# Patient Record
Sex: Female | Born: 1950 | Race: White | Hispanic: No | Smoking: Former smoker
Health system: Southern US, Community
[De-identification: ages and names within clinical notes are randomized; demographics above are authoritative.]

## PROBLEM LIST (undated history)

## (undated) DIAGNOSIS — I10 Essential (primary) hypertension: Secondary | ICD-10-CM

## (undated) DIAGNOSIS — E782 Mixed hyperlipidemia: Secondary | ICD-10-CM

## (undated) DIAGNOSIS — J449 Chronic obstructive pulmonary disease, unspecified: Secondary | ICD-10-CM

## (undated) DIAGNOSIS — F0390 Unspecified dementia without behavioral disturbance: Secondary | ICD-10-CM

## (undated) DIAGNOSIS — I2510 Atherosclerotic heart disease of native coronary artery without angina pectoris: Secondary | ICD-10-CM

## (undated) DIAGNOSIS — N059 Unspecified nephritic syndrome with unspecified morphologic changes: Secondary | ICD-10-CM

## (undated) DIAGNOSIS — R0602 Shortness of breath: Secondary | ICD-10-CM

## (undated) DIAGNOSIS — E0590 Thyrotoxicosis, unspecified without thyrotoxic crisis or storm: Secondary | ICD-10-CM

## (undated) DIAGNOSIS — G459 Transient cerebral ischemic attack, unspecified: Secondary | ICD-10-CM

## (undated) DIAGNOSIS — K279 Peptic ulcer, site unspecified, unspecified as acute or chronic, without hemorrhage or perforation: Secondary | ICD-10-CM

## (undated) HISTORY — PX: ABDOMINAL HYSTERECTOMY: SHX81

## (undated) HISTORY — DX: Atherosclerotic heart disease of native coronary artery without angina pectoris: I25.10

## (undated) HISTORY — DX: Unspecified nephritic syndrome with unspecified morphologic changes: N05.9

## (undated) HISTORY — DX: Essential (primary) hypertension: I10

## (undated) HISTORY — DX: Unspecified dementia, unspecified severity, without behavioral disturbance, psychotic disturbance, mood disturbance, and anxiety: F03.90

## (undated) HISTORY — DX: Thyrotoxicosis, unspecified without thyrotoxic crisis or storm: E05.90

## (undated) HISTORY — PX: OTHER SURGICAL HISTORY: SHX169

## (undated) HISTORY — DX: Mixed hyperlipidemia: E78.2

## (undated) HISTORY — DX: Peptic ulcer, site unspecified, unspecified as acute or chronic, without hemorrhage or perforation: K27.9

## (undated) HISTORY — PX: LUMBAR SPINE SURGERY: SHX701

## (undated) HISTORY — PX: HEMORRHOID SURGERY: SHX153

## (undated) HISTORY — PX: CARPAL TUNNEL RELEASE: SHX101

---

## 2002-03-27 ENCOUNTER — Encounter: Admission: RE | Admit: 2002-03-27 | Discharge: 2002-06-25 | Payer: Self-pay

## 2003-06-03 ENCOUNTER — Inpatient Hospital Stay (HOSPITAL_COMMUNITY): Admission: EM | Admit: 2003-06-03 | Discharge: 2003-06-04 | Payer: Self-pay | Admitting: Emergency Medicine

## 2006-01-29 ENCOUNTER — Ambulatory Visit: Payer: Self-pay | Admitting: Physical Medicine and Rehabilitation

## 2006-01-29 ENCOUNTER — Encounter
Admission: RE | Admit: 2006-01-29 | Discharge: 2006-04-29 | Payer: Self-pay | Admitting: Physical Medicine and Rehabilitation

## 2006-03-01 ENCOUNTER — Ambulatory Visit (HOSPITAL_COMMUNITY)
Admission: RE | Admit: 2006-03-01 | Discharge: 2006-03-01 | Payer: Self-pay | Admitting: Physical Medicine and Rehabilitation

## 2006-03-26 ENCOUNTER — Ambulatory Visit: Payer: Self-pay | Admitting: Physical Medicine and Rehabilitation

## 2006-03-29 ENCOUNTER — Ambulatory Visit (HOSPITAL_COMMUNITY)
Admission: RE | Admit: 2006-03-29 | Discharge: 2006-03-29 | Payer: Self-pay | Admitting: Physical Medicine and Rehabilitation

## 2006-05-16 ENCOUNTER — Ambulatory Visit: Payer: Self-pay | Admitting: Physical Medicine and Rehabilitation

## 2006-05-16 ENCOUNTER — Encounter
Admission: RE | Admit: 2006-05-16 | Discharge: 2006-08-14 | Payer: Self-pay | Admitting: Physical Medicine and Rehabilitation

## 2006-07-19 ENCOUNTER — Ambulatory Visit: Payer: Self-pay | Admitting: Physical Medicine and Rehabilitation

## 2006-08-16 ENCOUNTER — Encounter
Admission: RE | Admit: 2006-08-16 | Discharge: 2006-11-14 | Payer: Self-pay | Admitting: Physical Medicine and Rehabilitation

## 2006-09-13 ENCOUNTER — Ambulatory Visit: Payer: Self-pay | Admitting: Physical Medicine and Rehabilitation

## 2006-11-14 ENCOUNTER — Encounter
Admission: RE | Admit: 2006-11-14 | Discharge: 2007-02-12 | Payer: Self-pay | Admitting: Physical Medicine and Rehabilitation

## 2006-11-14 ENCOUNTER — Ambulatory Visit: Payer: Self-pay | Admitting: Physical Medicine and Rehabilitation

## 2007-01-08 ENCOUNTER — Ambulatory Visit: Payer: Self-pay | Admitting: Physical Medicine and Rehabilitation

## 2007-03-05 ENCOUNTER — Ambulatory Visit: Payer: Self-pay | Admitting: Physical Medicine and Rehabilitation

## 2007-03-05 ENCOUNTER — Encounter
Admission: RE | Admit: 2007-03-05 | Discharge: 2007-06-03 | Payer: Self-pay | Admitting: Physical Medicine and Rehabilitation

## 2007-04-29 ENCOUNTER — Ambulatory Visit: Payer: Self-pay | Admitting: Physical Medicine and Rehabilitation

## 2007-06-24 ENCOUNTER — Encounter
Admission: RE | Admit: 2007-06-24 | Discharge: 2007-09-22 | Payer: Self-pay | Admitting: Physical Medicine and Rehabilitation

## 2007-06-24 ENCOUNTER — Ambulatory Visit: Payer: Self-pay | Admitting: Physical Medicine and Rehabilitation

## 2007-08-19 ENCOUNTER — Ambulatory Visit: Payer: Self-pay | Admitting: Physical Medicine and Rehabilitation

## 2007-08-20 ENCOUNTER — Ambulatory Visit (HOSPITAL_COMMUNITY)
Admission: RE | Admit: 2007-08-20 | Discharge: 2007-08-20 | Payer: Self-pay | Admitting: Physical Medicine and Rehabilitation

## 2008-04-15 ENCOUNTER — Ambulatory Visit (HOSPITAL_COMMUNITY): Admission: RE | Admit: 2008-04-15 | Discharge: 2008-04-15 | Payer: Self-pay | Admitting: Neurosurgery

## 2008-08-16 ENCOUNTER — Encounter: Admission: RE | Admit: 2008-08-16 | Discharge: 2008-08-16 | Payer: Self-pay | Admitting: Neurosurgery

## 2008-09-15 ENCOUNTER — Inpatient Hospital Stay (HOSPITAL_COMMUNITY): Admission: RE | Admit: 2008-09-15 | Discharge: 2008-09-18 | Payer: Self-pay | Admitting: Neurosurgery

## 2009-03-28 ENCOUNTER — Encounter: Admission: RE | Admit: 2009-03-28 | Discharge: 2009-03-28 | Payer: Self-pay | Admitting: Neurosurgery

## 2009-04-13 ENCOUNTER — Encounter: Admission: RE | Admit: 2009-04-13 | Discharge: 2009-04-13 | Payer: Self-pay | Admitting: Neurosurgery

## 2009-05-17 ENCOUNTER — Encounter: Admission: RE | Admit: 2009-05-17 | Discharge: 2009-05-17 | Payer: Self-pay | Admitting: Neurosurgery

## 2009-06-01 ENCOUNTER — Encounter: Admission: RE | Admit: 2009-06-01 | Discharge: 2009-06-01 | Payer: Self-pay | Admitting: Neurosurgery

## 2011-02-20 NOTE — Assessment & Plan Note (Signed)
[name] is a 60 year old female who is being seen in our pain and  rehab clinic for chronic low back pain and right leg pain.   Pain is worse in the right leg with weightbearing.  She states she does  have some numbness down the back of the leg on occasion, bothers her at  night, some tingling, and dysesthetic sensations.  Pain is worse with  activity, improves with rest and medication.  She is getting good relief  with the current meds.  She is able to walk about 25 minutes at a time.  She is independent with her self-care.  Denies problems controlling  bowel or bladder.  Admits to some depression.  Denies suicidal ideation.   She states in the last month she has had bad bronchitis.  Dr. Liliane Bade  has been taking care of her for this, has had her on some antibiotics.  She had some chest wall pain and he placed her on some Darvocet.  Urine  drug screen done on July 24, 2007, did show propoxyphene in her  urine.  Also, positive for Percocet which is what is prescribed by our  clinic.  She is not on any hydrocodone but states that this was  inconsistent, however, and it was a negative hydrocodone in her urine  drug screen; however, she is currently not on this medicine.   Results of the UDS were reviewed with her today.  She states that Dr.  Liliane Bade just gave her a few Darvocet, less than 10 Darvocet, for her  chest wall pain.  She did not realize it was an opioid narcotic and she  took it for her chest wall pain relief.   She continues to smoke about a pack of cigarettes a day.  She live  alone.   Medications provided by our clinic include only Percocet 7.5/325 two to  4 times a day, #100 per month.   EXAM:  Today, blood pressure is 109/63, pulse 67, respirations 16, and  99% saturated on room air.  She is an obese female who appears her  stated age and does not appear in any distress.  She is oriented x3.  Speech is clear.  Affect is bright, alert, cooperative, and pleasant.  Follows commands without difficulty.  Transitioning from sit to stand  done without problems.  Gait in the room is normal.  Tandem gait.  Romberg test performed adequately.  Limitations are noted in lumbar  motion in all planes.  Seated reflexes are intact at the patellar and  Achilles tendon.  Motor strength is in the 5/5 range without focal  deficit.  Internal and external rotation of her right hip does aggravate  her pain in the posterior buttock region, seems to radiate down the  thigh somewhat as well.  Patient is worse with ambulation in this leg in  this distribution as well.   PLAN:  1. Status post multiple lumbar surgeries, last done 1992, Dr. Claudette Head.  2. Right leg pain, worse with ambulation.  3. Intermittent trochanteric bursitis.  4. Chronic obstructive pulmonary disease.  5. History of Bright disease.  6. History of carpal tunnel syndrome, currently not a problem at this      point.   PLAN:  1. We will refill her Percocet 7.5/325 two to 4 times a day, #100, no      refills.  Nursing staff reviewed her narcotic agreement with her      today.  She needs to have just 1 pharmacy.  She has been using two.      She needs to have 1 prescriber for her narcotics.  She states she      understands this and will comply.  2. UDS in the next couple of months to half a year where we will also      do a pharmacy check and we will obtain some radiographs of her      right hip to rule out hip osteoarthritis.  We will see her back in      a month.           ______________________________  Brantley Stage, M.D.     DMK/MedQ  D:  08/20/2007 13:45:18  T:  08/21/2007 10:41:18  Job #:  [id number]   cc:   Dr. Gae Gallop

## 2011-02-20 NOTE — H&P (Signed)
Fowler, Susan             ACCOUNT NO.:  [id number]   MEDICAL RECORD NO.:  [id number]          PATIENT TYPE:  INP   LOCATION:  3010                         FACILITY:  [facility]   PHYSICIAN:  Hilda Lias, M.D.   DATE OF BIRTH:  05/16/1951   DATE OF ADMISSION:  09/15/2008  DATE OF DISCHARGE:                              HISTORY & PHYSICAL   Susan Fowler is a lady who was seen initially in July complaining of back  pain with radiation to the right leg.  This lady in the past has had  three lumbar fusions at the level of 4-5, 5-1.  Off and on she is having  back pain for almost 17 years, but now it is getting worse down to the  right leg associated with weakness and numbness sensation.  We did some  x-rays including diskogram, which was positive at the level of L2-L3 and  L3-L4 with a herniated disk and degenerative disk disease and facet  arthropathy at those two levels.  The area where she was fused before 4-  5, 5-1 is solid.  She want to proceed with surgery.   PAST MEDICAL HISTORY:  She has a history of hypertension, increased  cholesterol.  She has a hysterectomy, carpal tunnel surgery, and lumbar  fusion by somebody else at the level of 4-5, 5-1.   FAMILY HISTORY:  Positive for diabetes and hypertension.   SOCIAL HISTORY:  Negative.   PHYSICAL EXAMINATION:  GENERAL:  The patient came to my office limping  from the right leg.  HEAD, EARS, NOSE AND THROAT:  Normal.  NECK:  Normal.  LUNGS:  Clear.  ABDOMEN:  Normal.  EXTREMITIES:  Normal.  NEUROLOGIC:  She has decreased flexion of the lumbar spine.  She has  lumbar spine scar from previous surgery.  She has normal strength in the  upper and lower extremities with mild weakness with dorsiflexion of the  right foot.   X-rays including the diskogram showed that she has a positive diskogram  at the level of 2-3 and 3-4 with degenerative disk disease and facet  arthropathy.   CLINICAL IMPRESSION:  Degenerative disk disease  2-3, 3-4.  Status post  lumbar fusion 4-5, 5-1.   RECOMMENDATIONS:  The patient is being admitted for surgery.  The  procedure will be at L2-3, 3-4 diskectomy, interbody fusion with pedicle  screws.  She knows about the risks with surgery such as infection, CSF  leak, worsening pain, no improvement whatsoever, need for further  surgery, paralysis, damage to the conus, which might affect the bladder  and bowel.           ______________________________  Hilda Lias, M.D.    EB/MEDQ  D:  09/15/2008  T:  09/16/2008  Job:  [id number]

## 2011-02-20 NOTE — Assessment & Plan Note (Signed)
Susan Fowler is a 60 year old white female who is being seen in our pain  and rehab clinic for chronic low back pain, radiating right lower  extremity pain.  She has a diagnosis of spinal stenosis and occasional  trochanteric bursitis.   She is back in today.  Average pain is about a 6 on a scale of 10.  Again, pain localized to the right low back to the left and right  buttocks, posteriorly down the leg to the foot, worse with walking and  sitting, improved with rest, medication.  Also seems to improve with  pool activities.  She has had access to a pool for the last couple of  months and has done quite well with it.  She reports good relief with  the current medications that she is prescribed.   Medications from this clinic include:  1. Percocet 7.5/325 mg one p.o. b.i.d. to q.i.d., #100 per month.  2. She is currently no longer on Neurontin.   She is able to walk about 15 minutes at a time.  She is able to climb  stairs and drive.  She is independent with her self-care, needs some  assistance with higher-level household duties.  She admits to some  anxiety.  She denies depression and suicidal ideation.  She denies  problems controlling bowel or bladder.  She is a smoker, a pack a day,  recently was treated with bronchitis with shots and antibiotics.  She  follows up with Dr. Virgina Organ for her primary care problems.   On exam today, her blood pressure is 82/53, pulse 68, respirations 18,  97% saturated on room air.  She a well-developed, well-nourished, mildly obese female who appears  her stated age.  She is oriented x3.  Affect is bright, alert,  cooperative and present.  Speech is clear.  Follows commands without  difficulty.  Transitions from sitting to standing easily.  Gait in the room is not  antalgic.  Tandem gait, Romberg test are performed adequately.  Limitations noted in lumbar motion, especially with lumbar extension,  very little extension noted.  She has a minimal  amount of lumbar  flexion.  Reflexes are 1+ at the patellar and Achilles tendons.  No abnormal tone  is noted.  Motor strength is in the 5/5 range.  Straight leg raise  negative.  No clonus is noted.   IMPRESSION:  1. Status post multiple lumbar surgeries.  Last surgery was 1992 with      Dr. Jeral Fruit.  2. Spinal stenosis-like symptoms with diminished capacity to ambulate,      intermittent chronic right lower extremity neuropathic-type pain.  3. Intermittent right trochanteric bursitis.  4. Chronic obstructive pulmonary disease.  5. History of Bright disease.  6. History of carpal tunnel, currently not a problem at this time.   PLAN:  Refilled Percocet 7.5/325 mg one p.o. b.i.d. to q.i.d. p.r.n.  back pain, #100, no refills.  Follow up with nursing staff next month  for refill of her medications.  I will see her back in 2 months.  She  has not exhibited any aberrant behavior, her pill counts are  appropriate, and she is able to maintain a relatively functional  lifestyle, including engaging in some physical activity including a pool  program at this time.          ______________________________  Brantley Stage, M.D.    DMK/MedQ  D:  04/30/2007 11:18:41  T:  04/30/2007 15:21:08  Job #:  [id number]  cc:   Dr. Virgina Organ

## 2011-02-20 NOTE — Assessment & Plan Note (Signed)
[name] is a 60 year old white female who is being seen in our Pain  and Rehab Clinic for complaints of chronic low back pain and radiating  right lower extremity pain.  She has the diagnosis of spinal stenosis  and occasional trochanteric bursitis.  She is back in today and states  her average pain is about 6 on a scale of 10.  The pain is described as  constant, sharp and burning in nature, localized to the right lumbar  region, through the buttock and down the right posterior leg to the  lateral foot.  The pain waxes and wanes in the course of standing and in  prolonged position such as sitting, repetitive bending or walking and  improved with rest, heat or medications.  She gets fair relief with the  current medications prescribed from this clinic which include Percocet  7.5/325 one p.o. twice daily to four times daily.   She is able to walk about 20 minutes at a time.  She is looking forward  to the summer so she can start doing some swimming.  She is able to  climb stairs, able to drive, independent with self-care and is able to  engage in higher level household activities.  She does admit to  depression but denied suicidal ideation.  In the last month since she  was seen, she has had treatment for bronchitis and for a vaginal yeast  infection.  Her PCP is Dr. Dyann Ruddle.  Otherwise, no changes in her social  or family history.  She continues to smoke one pack of cigarettes per  month.  I cautioned against this again.   PHYSICAL EXAMINATION:  VITAL SIGNS:  Blood pressure this morning was  85/50, pulse 61, respirations 16, 97% saturations on room air.  GENERAL:  She is well-developed, mildly obese white female who does not  appear to be in any distress, oriented x3, speech clear, affect  appropriate, alert, cooperative and pleasant.  She follows commands  without any difficulty.  MUSCULOSKELETAL:  She transitions from sitting to standing easily.  Gait  is symmetric and nonantalgic.   She has limitations in lumbar extension.  She has just mild limitations with lumbar flexion.  Reflexes are  symmetric at the patella and Achilles tendon bilaterally.  No abnormal  tone is noted, no clonus is noted.  Pinprick is intact in the lower  extremities.  Her motor strength is 5/5 in knee flexors, knee extensors,  dorsiflexors and plantar flexors.  Straight leg raise is negative.   IMPRESSION:  1. Status post multiple lumbar surgeries; last surgery was in 1992      with Dr. Jeral Fruit.  2. Spinal stenosis-like symptoms with diminished capacity to ambulate      and intermittent chronic right lower extremity neuropathic type      pain.  3. Intermittent right trochanteric bursitis.  4. Chronic obstructive pulmonary disease.  5. History of Bright's disease.  The patient did bring in her blood      work which was done on 10/30/2006.  Her BUN was 20, creatinine      0.98.  The rest of her blood work is attached to the chart today.  6. She does have a history of carpal tunnel which is currently not a      problem at this time.   PLAN:  1. We will refill her Percocet 7.5/325 one p.o. twice daily to four      times daily (100, 0 refills).  2. She continues  to be a high-functioning individual.  She continues      to walk several times per day up to 20 minutes at a time.  She is      able to help take care of her family.  She is independent with all      of her self-care.  We      will see her back in 2 months with a nursing visit next month for      refill of her medications.  She has been stable on Percocet.  No      aberrant behavior has been displayed and she has appropriate pill      counts.           ______________________________  Brantley Stage, M.D.     DMK/MedQ  D:  03/06/2007 09:48:55  T:  03/06/2007 10:25:53  Job #:  [id number]   cc:   Prescott Parma  Fax: [phone]

## 2011-02-20 NOTE — Op Note (Signed)
NAMECHRISTABELLA, ALVIRA             ACCOUNT NO.:  [id number]   MEDICAL RECORD NO.:  [id number]          PATIENT TYPE:  INP   LOCATION:  3010                         FACILITY:  [facility]   PHYSICIAN:  Hilda Lias, M.D.   DATE OF BIRTH:  [birth year]   DATE OF PROCEDURE:  09/15/2008  DATE OF DISCHARGE:                               OPERATIVE REPORT   PREOPERATIVE DIAGNOSES:  Chronic back pain with degenerative disk  disease, L2-L3, L3-L4.  Status post fusion, L4-L5, L5-S1.   POSTOPERATIVE DIAGNOSES:  Chronic back pain with degenerative disk  disease, L2-L3, L3-L4.  Status post fusion, L4-L5, L5-S1.   PROCEDURES:  Bilateral L3-L4 laminectomy and facetectomy, L2-L3 total  diskectomy, foraminotomy, interbody fusion with cage at the level of L2-  L3, pedicle screws L2, L3, L4 bilaterally, posterolateral arthrodesis  with autograft and BMP, Cell Saver, and C-arm.   SURGEON:  Hilda Lias, MD   CLINICAL HISTORY:  Mrs. Coatney is a lady who in the past underwent  several surgeries ending with a fusion at the level of L4-L5 and L5-S1  in an another hospital.  Lately, she had been complaining of back pain  with radiation to both legs.  Right is worse than the left one.  X-ray  and diskogram showed that she had severe degenerative disk disease and  facet arthropathy at the level of L2-L3 and L3-L4.  The areas between L4-  L5 and L5-S1 are grossly normal.  The patient has failed with  conservative treatment.  She wanted to proceed with surgery.   PROCEDURE IN DETAIL:  The patient was taken to the OR and she was  positioned in a prone manner.  Because of her obesity, it was difficult  to feel any spinous process.  X-ray showed that we were at the level of  L2.  A midline incision from L2 down to the upper part of the previous  incision was made with muscle and fascia retracted all the way  laterally.  We repeated another x-ray which showed that indeed the clips  were at the level of the spinous  process of L2 and L3.  From then on,  after we had good retraction, we removed the spinous process of L2 and  L3 and we did a bilateral laminectomy.  Still it was difficult to get  into the disk space and to be able to get into the disk, we proceeded  with bilateral facetectomy of L3 and L2.  From then on, it was easy to  retract the thecal sac.  We entered the disk.  Another x-ray was  obtained, which showed that indeed we were at the level of the disk of  L2-L3.  Retraction of the thecal sac was made.  We entered the disk  space and total gross diskectomy was achieved.  The endplate was  removed.  Then 2 cages of 12 x 22 with autograft and BMP inside were  inserted.  The rest of the disk was filled up with autograft.  We tried  to enter at the level of L3-L4 disk, but this area was completely fused.  Because  of that, we decided not to proceed with any interbody fusion at  this level.  We decompressed the L2, L3, and L4 nerve roots.  Then with  the C-arm, first in AP view and later on the lateral view, we probed the  pedicle of L2, L3, and L4.  At the end, we were able to insert 6 pedicle  screws of 5.5 x 40.  Prior to insertion, we were sure that the probe and  the hole in the pedicle was surrounded by bones.  After that, the  pedicle screws were connected with rods and secured in place with caps.  Then a cross-link from right to left was used.  We went laterally and we  removed the periosteum of L2, L3, and L4 as well as the transverse  process.  Then a mix of BMP and autograft was used for arthrodesis.  Valsalva maneuver was negative.  Fentanyl was left in the epidural space  and the wound was closed with Vicryl and staples.           ______________________________  Hilda Lias, M.D.     EB/MEDQ  D:  09/15/2008  T:  09/16/2008  Job:  [id number]

## 2011-02-20 NOTE — Assessment & Plan Note (Signed)
Susan Fowler is a 60 year old woman who is being followed in our pain and  rehabilitative clinic for chronic low back pain and right leg pain.   She states her average pain is about a 7/10 in the low back, about a  5/10 in the leg.  The pain is described as sharp, constant, aching in  nature.  Localized to the right low back and radiates posteriorly down  the right leg.   Pain is worse with walking, bending, sitting, prolonged standing.  Improves with rest, medications.  The pain is worse toward the end of  the day.   She is able to walk at least 30 minutes at a time.  She is able to climb  stairs and drive.  She is independent with her self care.   REVIEW OF SYSTEMS:  Otherwise, noncontributory.   Past social, medical, and family history otherwise unchanged.  Continues  to smoke a pack of cigarettes a day.  Cautioned against this.   SULFA allergy and TAPE allergy.   MEDICATIONS PRESCRIBED BY THIS CLINIC:  Percocet 7.5/325 one p.o. b.i.d.  to q.i.d. #100 p.r.n. back or leg pain.   EXAM:  Blood pressure 108/73, pulse 73, respirations 18, 95% saturation  on room air.  She is well-developed, well-nourished female who does not appear in any  distress.  She is oriented x3.  Her speech is clear.  Her affect is  bright.  She is alert, cooperative, and pleasant, and she follows  commands without any difficulty.   She transitions from sitting to standing easily.  Gait in the room is  normal.  Tandem gait, Romberg test are performed adequately.  Limitations are noted in lumbar motion in both flexion as well as  extension.  Pain is worse with extension.   Reflexes are 1+ at the patellar and Achilles tendons.  Symmetric.  No  abnormal tone is noted.  No clonus is noted.  Motor strength is 5/5 at  the hip flexors, knee extensors, dorsiflexors, plantar flexors without  any weakness noted.   IMPRESSION:  Lumbago status post lumbar surgery.  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  Also noted was some left foraminal encroachment  with possible impingement of left L3 nerve root, although she has no  left leg pain.   Flexion and extension films done the month prior to that showed no  instability.   She does have intermittent trochanteric bursitis.  Her medical problems  include:  1. Chronic obstructive pulmonary disease.  2. History of Bright's disease.  3. History of carpal tunnel syndrome.   PLAN:  Will refill her Percocet 7.5/325 two to four times a day 100 per  month, no refills.  She states she is complying and using only 1  pharmacy, and is taking her medications as appropriate.  Anticipate UDS  in the next  few months.  She would like to hold off on any kind of diagnostic study  until after Christmas, in January.  Anticipate we will order some hip  radiographs.           ______________________________  Brantley Stage, M.D.     DMK/MedQ  D:  09/17/2007 12:35:08  T:  09/17/2007 17:16:47  Job #:  [id number]

## 2011-02-20 NOTE — Assessment & Plan Note (Signed)
Susan Fowler is a 60 year old white female who is being seen in our Pain  and Rehabilitative Clinic for chronic low back pain, radiating right  lower extremity pain.  She is diagnosed with spinal stenosis and  occasional trochanteric bursitis.   She is back in today requesting refill of her medications.  Her average  pain is between a 6 and a 7 on a scale of 10, moderately interfering  with her activity levels and enjoyment of life.  Pain is described as  achy, dull, and radiating at times to the buttock and down the right  lower extremity posteriorly to the ankle.   Pain is typically worse toward the end of the day.  Medications provided  by this clinic give her between fair and good relief.   Functional status is quite good.  She is independent with her self-care  and higher-level activities.  Able to walk 25 minutes at a time.  She is  able to drive and climb stairs.   No other changes noted in the past medical, social, or family history.   REVIEW OF SYSTEMS:  Noncontributory today.   Continues to smoke a pack of cigarettes a day.   Blood pressure today is 100/62, pulse 75, respirations 18; 96% saturated  on room air.  She is a well-developed, mildly obese female who appears  her stated age.  She does not appear in any distress.  She is oriented  x3.  Speech is clear.  Affect is bright, alert.  She is cooperative and  pleasant.  She follows commands without difficulty.   Transitioning from sit to stand is done somewhat slowly.  She has a  normal gait, however, in the room; tandem gait.  Romberg's test is  performed adequately seated.  Reflexes are symmetric and intact in the  lower extremities without focal deficit.   Motor strength is in the 5/5 range at hip flexors, knee extensors,  dorsiflexors, plantar flexors, EHL.  Straight leg raise negative.   Sensory exam not performed today.   Medications provided by this clinic include Percocet 7.5/325 one p.o.  b.i.d. to q.i.d.,  #100 per month.   IMPRESSION:  1. Status post multiple lumbar surgeries; last surgery done in 1992      with Dr. Jeral Fruit.  2. Spinal stenosis-like symptoms with diminished capacity to ambulate.      Intermittent chronic right lower extremity neuropathic-type pain.  3. Intermittent trochanteric bursitis.  4. Chronic obstructive pulmonary disease.  5. History of Bright's disease.  6. History of carpal tunnel syndrome, currently not a problem at this      time.   PLAN:  We will plan to refill her Percocet for her, 7.5/325 one p.o.  b.i.d. to q.i.d. p.r.n. back pain, right leg pain, No. 100, no refills.   She has not exhibited any aberrant behavior with use of this medication.  Her pill counts have been appropriate, and she is able to maintain a  relatively functional lifestyle at this time.  We will have a nursing  visit for her next month for refill of medications.  I will see her back  in 2 months.           ______________________________  Brantley Stage, M.D.     DMK/MedQ  D:  06/25/2007 09:07:38  T:  06/25/2007 10:10:00  Job #:  [id number]

## 2011-02-23 NOTE — Assessment & Plan Note (Signed)
The patient is a 60 year old white female who is being seen in our [facility] for complaints of low back pain and radiating  right lower extremity pain, which is consistent with spinal stenosis and  an occasional right trochanteric bursitis.   She is back in today, states her average pain is about 6 on a scale on  10.   She states she has lost about 12 pounds, since I had last saw her in  February 2008.  She has been dieting and walking regularly.   Her pain is controlled with two to four 7.5/325 Percocet each day.   She reports no problems with constipation or oversedation from the  medication.  She is functional, independent with self care and higher  level household activities.   No trouble controlling bowel or bladder.  No problems with depression,  anxiety, or suicidal ideation.   She continues to maintain contact with Dr. Marilu Favre, who manages her  primary care needs.  She states she has had some bronchitis several  times since I saw and she had some recent mammograms, which were  apparently read as okay.   She continues to smoke a pack of cigarettes a day.  Cautioned against  this.   EXAMINATION:  Her blood pressure is 107/70.  Pulse 96.  Respirations 18.  One hundred percent saturated on room air.   She is a well-developed, mildly obese female who appears her stated age.  She does not appear in any distress.  She is oriented x3.  Her affect is  bright, alert.  She is cooperative, pleasant, and she follows commands  without any difficulty.  She transitions from sit to stand easily.  Her  gait in the room is stable.  She is able to perform tandem gait and  Romberg's test without difficulty.   Limitations are noted in her lumbar range of motion in all planes.  She  has essentially no extension and minimal forward flexion.   Reflexes are 2 at the patellar tendons bilaterally, 2 at the Achilles  tendons bilaterally.  No abnormal tone is noted.  No  clonus is noted.  Pinprick does not reveal any sensory deficit in the lower extremities  below the knees.   Her motor strength is 5/5 with hip flexors, knee extensors,  dorsiflexors, plantar flexors.  Slight difference in EHL, right versus  left, just a little weaker on the right compared to left foot in the 4+  range.   Straight leg raise negative.   IMPRESSION:  1. Status post multiple lumbar surgeries.  Two surgeries in 1991, last      one in 1992, Dr. __________.  2. Spinal stenosis-like symptoms with diminished capacity to ambulate      and fairly chronic right lower extremity neuropathic type pain.  3. Intermittent right trochanteric bursitis.  4. Chronic obstructive pulmonary disease.  5. 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  6. History of anxiety/depression, currently not a problem at this      time.  7. History of carpal tunnel syndrome, also not a problem at this time.   PLAN:  She continues to be a fairly high functioning individual.  She is  walking several times a day and dieting and losing weight.   She is doing well on Percocet, not more than 100 pills per month.  Taking them between 2 and 4 times a day.  Her pill counts  have been  appropriate.  She does not call in for early request.  She displays no  aberrant behavior in this clinic.   Would like her to bring in copy of blood work with BUN and creatinine on  it for our chart in light of her history of kidney problems in the past.  She will try to get a copy from Dr. Marilu Favre prior to our next visit,  which will be in 1 month.   We will cc a copy of my note over to Dr. Marilu Favre as well.           ______________________________  Brantley Stage, M.D.     DMK/MedQ  D:  02/06/2007 10:19:06  T:  02/06/2007 11:17:01  Job #:  [id number]   cc:   Marilu Favre, M.D.

## 2011-02-23 NOTE — Assessment & Plan Note (Signed)
Susan Fowler is a 60 year old white female who is a patient of Dr. Rollen Sox.   She is being seen for chronic right low back pain, lateral leg pain and  posterior thigh pain.   She states her pain in the clinic today is about 3 on a scale of 10,  averages typically around an 8 on a scale of 10.  She improves her pain  scores after she takes her medications.  She reports good relief with the  current medications that she is on.  She typically takes two Percocet 4  times a day.   She did not bring in her medications for pill count today.  She is currently  still getting her medications filled by Dr. Dyann Ruddle.   Pain is typically located in the low back, radiates down the lateral right  leg as well as the posterior right leg, appears to be two different types of  pain here.  She can walk about 20 minutes at a time.  She is able to climb  stairs, she drives.  She stays quite active.  Typically is flared up doing  activities such as changing sheets, which might require her to bend forward  and twist in her lumbar area.   No new changes in past medical social or family history since our last  visit.  The patient was asked to obtain previous old MRI films for this  visit; however, she was unable to obtain them.  Apparently they are from the  1990's and have been archived.  She is having a difficult time accessing  them.   EXAM TODAY:  Blood pressure 95/46, pulse 67, respirations 16, 98% saturated  on room air.  She is an obese white female who appears her stated age.  She  is oriented x3.  Affect is bright, alert, cooperative and pleasant.   She is able to stand.  She transitions from sit-to-stand without any  difficulties.  Her gait in the room is nonantalgic.  She has a fairly  symmetric gait pattern.  Balance is good.  Romberg's test and tandem gait is  within normal limits.  Seated reflexes are 2+ patella tendons and Achilles  tendons.  Motor strength is good in the lower extremities.   Lumbar area  reveals old scars over the lumbar midline as well as over bilateral iliac  crest.   IMPRESSION:  1.  Status post lumbar fusion 1992, Dr. Jeral Fruit.  It is not clear which      levels were done.  Need to obtain some operative notes.  At this point,      would like to obtain flexion extension films of her lumbar spine to rule      out any new instability.  2.  Intermittent neuropathic pain right lower extremity.  3.  Trochanteric bursitis/iliotibial band syndrome.  Patient continues to be      tender on exam today when trochanter area, both right and left, are      palpated.  She is more tender on the right than on the left.  4.  Consider sacroiliac joint as possible pain generator in this case as      well, with history of lumbar fusion.  5.  History of chronic obstructive pulmonary disease.  6.  Questionable right kidney function.  7.  History of carpal tunnel surgery.  8.  History of anxiety/depression.   PLAN:  Will set patient up to obtain flexion extension lumbar films.  Discussed further interventions such as  a trochanter injection on the right,  possibly considering SI injection vs. facet injection.  Would like to obtain  x-rays prior to this.  She currently has medications provided by Dr. Dyann Ruddle  at this point.  She did not bring in her meds, is  not clear on what her pill counts are at this point.  She will bring them in  at the next visit.  Anticipate we will be taking over narcotic management  for her.  Will see her back in 1 month.           ______________________________  Brantley Stage, M.D.     DMK/MedQ  D:  03/01/2006 10:16:14  T:  03/01/2006 18:03:04  Job #:  [id number]   cc:   Prescott Parma  Fax: [phone]

## 2011-02-23 NOTE — Assessment & Plan Note (Signed)
HISTORY:  Susan Fowler is a 60 year old white female who is a patient of Dr.  Dyann Ruddle.  She is being seen in our pain and rehabilitation clinic for  management of chronic low back pain, radiating right lower extremity pain  consistent with spinal stenosis as well as right trochanteric bursitis.   She is back in for a recheck and refill of her medications.   On review of the medications she is taking she states that she is taking up  to 6 Excedrin a day and 8 Tylenol per day in addition to four Percocet per  day.  I calculated her acetaminophen intake to be about 5400 mg.   Back in June I had discussed with her how much acetaminophen I would like  her to be taking and that would be less than 2000 mg a day.  She states that  she needs to take something for her pain and to help her sleep at night.   She states her average pain is about a 7 on a scale of 10.  The pain is  described as sharp and burning, localized to the right low back through the  posterior thigh into the leg, especially when she is up walking.  The pain  is typically worse with walking and standing type activities.  She gets good  relief with the current medications she states.  She can walk about 10  minutes at a time.  She is independent with her self care.   PAST MEDICAL/SOCIAL/FAMILY HISTORY:  No changes in past medical, social or  family history.  She continues to smoke two to three cigarettes per day.   PHYSICAL EXAMINATION:  VITAL SIGNS:  Blood pressure 93/56, pulse 83,  respirations 16, 96% saturation on room air.  GENERAL APPEARANCE:  She is an obese female who appears her stated age.  She  is oriented x3.  Her affect is bright and alert.  She is cooperative  pleasant.  Her speech is clear.  She follows commands without any  difficulty.  MUSCULOSKELETAL:  She transitions from sit to stand without any difficulty.  Her balance is good.  Tandem gait and Romberg's test are within normal  limits.   She has  limitations in lumbar range of motion especially with forward  flexion and extension.  Seated her reflexes are symmetric at patellar  tendons and Achilles tendons and present.  Motor strength is good throughout  both lower extremities.  No focal weakness is noted.  She denies any sensory  deficits to light touch.  Straight leg raise is negative.   IMPRESSION:  1. Status post lumbar surgical procedure by Dr. Jeral Fruit in 1992.  2. Spinal stenosis-like symptoms with diminished capacity to ambulate and      right lower extremity pain.  3. Right trochanteric bursitis, which is overall improved.   PROBLEM LIST:  Medical problems include:  1. Chronic obstructive pulmonary disease.  2. Questionable right kidney function.  3. History of anxiety and depression.  4. History of carpal tunnel syndrome.   PLAN:  Thirty minutes were spent discussing use of medications with Ms.  Selvage again today.  I reiterate the importance of not taking more than 2000  mg of acetaminophen on a daily basis and she is doing this essentially on a  long term basis.  We discussed possibly following back up with Dr. Jeral Fruit.  She does have an MRI dated March 29, 2006 which shows mild spinal stenosis at  L2-L3 and moderate spinal stenosis  at L3-L4 with some foraminal encroachment  of the left L3 nerve root, however, her pain is in the right lower  extremity.   Other options we discussed are adding Neurontin.  She has been reluctant to  trial Neurontin in the past, however, she would like to consider it this  month.  We will start her on 300 mg at night and titrate this up to 3x a  day.  We also discussed the possibility of trialing an epidural.  She has  been quite adamant in the past about not undergoing any lumbar epidural  injections.  Apparently she had a bad experience over a decade ago but she  is now considering a trial, possibly in the upcoming months.   She has been taking her Percocet as directed.  She has not  displayed any  aberrant behavior with narcotic use in this clinic.  We will see her back in  a month.  We will send a copy of my note to Dr. Dyann Ruddle.  Will send a copy  of the lumbar MRI from June of 2007 in addition to the note today.           ______________________________  Brantley Stage, M.D.     DMK/MedQ  D:  07/22/2006 12:42:42  T:  07/23/2006 10:11:41  Job #:  [id number]   cc:   Prescott Parma  Fax: [phone]   CC of MRI June 07 to Dr Larena Glassman also.

## 2011-02-23 NOTE — Discharge Summary (Signed)
Susan Fowler, Susan Fowler             ACCOUNT NO.:  [id number]   MEDICAL RECORD NO.:  [id number]          PATIENT TYPE:  INP   LOCATION:  3010                         FACILITY:  [facility]   PHYSICIAN:  Clydene Fake, M.D.  DATE OF BIRTH:  02-Nov-1950   DATE OF ADMISSION:  09/15/2008  DATE OF DISCHARGE:  09/18/2008                               DISCHARGE SUMMARY   PREOPERATIVE DIAGNOSIS:  Degenerative disk disease at L2-3, L3-4 with  prior fusion below that with radiculopathy.   POSTOPERATIVE DIAGNOSIS:  Degenerative disk disease at L2-3, L3-4 with  prior fusion below that with radiculopathy.   PROCEDURE:  Bilateral laminectomy, facetectomy 2-3 and 3-4 with  interbody cages at 2-3 and pedicle screw fixation with allograft and  Infuse.   REASON FOR ADMISSION:  The patient is a 60 year old lady who has had  back and right leg pain with prior fusion at 4-5 and 5-1.  A workup was  done including diskogram, showing herniated disk, degenerative disk  disease, and positive diskogram at L2-3 and L3-4.  The patient was  brought in for decompression and fusion at these levels.   HOSPITAL COURSE:  The patient underwent surgery with Dr. Jeral Fruit on  September 15, 2008, with no complications.  The patient then transferred  from recovery room to the floor.  Postoperatively, she started making  progress, started working with PT to increase activity, working, getting  brace on.  She has continued making progress.  PCA was discontinued on  September 17, 2008, and started taking p.o. pills.  She is eating well  and ambulating well.  Incision was clean, dry, and intact on September 18, 2008, and was discharged home in stable condition with a rolling  walker to use as assistance to ambulate.  Increase activity, brace when  up.   MEDICINES:  Percocet and Flexeril p.r.n. and same, continue all her  preop medications.   FOLLOWUP:  Follow up in 10-12 days with Dr. Jeral Fruit.     ______________________________  Clydene Fake, M.D.     JRH/MEDQ  D:  11/04/2008  T:  11/05/2008  Job:  [id number]

## 2011-02-23 NOTE — Assessment & Plan Note (Signed)
HISTORY OF PRESENT ILLNESS:  Susan Fowler is a 60 year old white female who is  a patient of Dr. Rollen Sox.   She has been seen in our pain and rehabilitative clinic for management of  chronic low back pain, radiating right lower extremity pain, with diminished  capacity to ambulate, and right trochanteric bursitis.   She states that the right hip is still somewhat improved since the  injection.  She states she can sleep better, especially when she rolls over  onto that side it does not wake her up anymore.   She reports good relief with the current medications that she is on.  Last  month, she was switched to Percocet 7.5/325 up to 4 a day.  She states she  gets fairly good relief with the current medications that she is on.  The  pain is typically aggravated by prolonged walking, sitting, bending,  standing.  Improved with medications and rest.   She can walk about 10 minutes at a time.  She is independent with her self  care.  Denies bowel or bladder problems.  Denies suicidal ideation, anxiety  or depression.   MEDICATION:  Per Dr. Dyann Ruddle include:  1. Flexeril.  2. Albuterol.  3. Excedrin for migraines.  4. Tylenol PM.  5. Aspirin.  6. Paxil.  7. Mevacor.  8. Cardizem.  9. Hydrochlorothiazide.  10.Vasotec.  11.Lopressor.  12.Xanax.  She just recently started Xanax 0.5 three times a day to help      her with smoking cessation.  She had been smoking approximately 3 packs      a day at our last visit; she is down to a pack and a half now.   PHYSICAL EXAMINATION:  VITAL SIGNS:  Blood pressure is 84/46, pulse 74,  respirations 18, 96% saturation on room air.  GENERAL:  She is a well-developed, well-nourished female who appears her  stated age.  She is oriented x3.  Her affect is bright and alert.  She is  cooperative and pleasant.  She does not appear in any distress.   She transitions from sit to stand without much difficulty.  She does appear  a little bit stiff, however.   Her gait in the room is symmetric.  Normal  base of support is noted.  Her balance is good.  Romberg test is normal, and  tandem gait is also normal.  She has limitations in lumbar range.  Her  reflexes are 2+ at the patellar and Achilles tendons, symmetric.  No  abnormal tone is noted.  Motor strength is 5/5.  No focal weakness is  appreciated.  No new numbness is appreciated with light touch.   IMPRESSION:  1. Status post lumbar surgical procedure in 1992, Dr. Jeral Fruit.  2. Intermittent neuropathic pain in the right lower extremity with      stenosis-like symptoms.  3. Right trochanteric bursitis, overall improved.  4. Chronic obstructive pulmonary disease.  Reduced smoking from 3 packs a      day to 1-1/2 packs per day.  5. Questionable right kidney function.  6. History of anxiety depression.  7. History of carpal tunnel surgery.   PLAN:  Will refill her Percocet today, 7.5/325 one p.o. b.i.d. to q.i.d.,  #120.  She continues to take her medications appropriately.  No aberrant  behavior has been appreciated.  She is able to maintain function.  She is  attempting to lose weight, and she is able to get out into the community  somewhat with the  use of the medications.  I will see her back in 1 month.  She is stable on this medicine.           ______________________________  Brantley Stage, M.D.     DMK/MedQ  D:  06/17/2006 13:33:33  T:  06/18/2006 02:35:09  Job #:  [id number]

## 2011-02-23 NOTE — Group Therapy Note (Signed)
Ms. Grandmaison is a 60 year old white female referred by Dr. Virgina Organ.   She is referred for chronic low back and right leg pain.   She states she has a history of back pain dating back to the early 1990s.  She has had multiple surgeries per Dr. Jeral Fruit, two in 1990 and one in 1992,  but underwent a fusion at that time by Dr. Jeral Fruit.   Since that time she has had chronic low back pain and right leg pain.  Her  average pain is about a 6 on a scale of 1-10, typically worse in the morning  and at night.  Pain is worse with standing, improves with some rest and  medication.  Gets good relief with the current medications that she is on.   She states she is referred over for pain management of her low back and  right leg pain.   She is able to be up walking at least an hour at a time.  States she went  recently to a farmer's market.  She is able to climb stairs.  She is  currently driving.   She is independent with most of her self care and some higher level  activities including meal prep, household duties, and shopping.   She has been disabled since 1993.  She states she has had some problems with  her bladder.  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  I do not have any surgical  notes regarding this particular operation, however, in this chart.  She does  admit to some depression and anxiety.   PAST MEDICAL HISTORY:  1.  Positive for some lung problems.  2.  High blood pressure.  3.  Kidney disease.  4.  Arthritis.   PAST SURGICAL HISTORY:  1.  Three lumbar spine surgeries, Dr. Jeral Fruit 1990 x2, 1991 x1.  2.  History of carpal tunnel surgery x2.  3.  Urethral surgery.  4.  Hysterectomy.  5.  Hemorrhoid surgery.  6.  Tonsillectomy.   SOCIAL HISTORY:  Patient is divorced.  She lives alone.  Denies illegal drug  use.  Denies driving under the influence.  Denies alcohol use.  Denies  smoking.   FAMILY HISTORY:  Positive for hypertension, alcohol abuse.   Also of significance her son who was a  drug abuser killed himself at age 68.  Patient states mother is an alcoholic as well.   PHYSICAL EXAMINATION:  VITAL SIGNS:  Blood pressure 96/59, pulse 72,  respirations 16, 95% saturated on room air.  GENERAL:  She is an obese white female who appears her stated age.  She is  oriented x3.  Affect is bright, alert, cooperative, and pleasant.  NEUROLOGIC:  Transitions from sit to stand without much difficulty.  Gait is  normal.  Some limitations in lumbar range especially with forward flexion.  Extension she is also quite limited.  Sitted reflexes are symmetric and  intact in lower extremities.  Motor strength is 5/5 in the lower  extremities.  No focal weakness noted.  Straight leg raise is negative.  No  sensory deficits to light touch, pin prick, proprioception intact.   Patient is quite tender along the iliotibial band bilaterally, worse on the  right than on the left.   IMPRESSION:  1.  Lumbago status post lumbar fusion 1992 Dr. Jeral Fruit.  2.  Intermittent neuropathic right leg pain.  3.  Trochanteric bursitis/iliotibial band syndrome.  4.  History of hypertension.  5.  History of chronic obstructive pulmonary disease.  6.  Questionable right kidney function.  7.  History of carpal tunnel surgery.  8.  History of anxiety/depression.   PLAN:  Reviewed multiple avenues for treatment with her including  injections, tens unit, physical therapy.  She states she has gone through  most of this, is not so interested in repeating some of these things;  however, it has been quite a few years since she has had any other  interventions for her back pain.  We also discussed possibility using  Lidoderm, anti-epileptic medications for treatment of neuropathic pain,  trochanteric bursitis injection, possibly considering epidural.  We would  like to obtain an older lumbar MRI from her.  She will obtain this for [place]  for the next visit.  Will see her back in a month.  Also consider NSAID  therapy  with her, but would need to confirm normal kidney function, however.  Will see her back in a month.           ______________________________  Brantley Stage, M.D.     DMK/MedQ  D:  01/30/2006 14:04:55  T:  01/31/2006 11:58:07  Job #:  [id number]   cc:   Virgina Organ, M.D.

## 2011-02-23 NOTE — Assessment & Plan Note (Signed)
Susan Fowler is a 60 year old white female who is a patient of Dr. Dyann Ruddle.  She has been seen in our [facility] predominantly for  chronic low back pain, radiating right lower extremity pain, consistent with  spinal stenosis and occasional right trochanteric bursitis.   Average pain is about 7 on a scale of 10, although it has improved this last  month.  She reports sleeping well now.  She gets fair relief with the  current medications that she is on.   She states since she started Neurontin last month she has had an overall  improvement in her leg pain as well as an improvement in how far she can  walk.  She states she has been able to walk all the way through [employer] now  and previously to starting Neurontin she had not been able to do this.   She is independent with her self care.  She denies suicidal ideation.  She  does admit to some depression.   REVIEW OF SYSTEMS:  Noted on the health and history form and is attached to  the chart.   No new changes in past medical, social or family history since last visit.   MEDICATIONS:  From this clinic include:  1. Percocet 7.5/325 one p.o. b.i.d. to q.i.d., #100.  2. Neurontin 300 mg one p.o. t.i.d.   EXAM:  Today, blood pressure is 74/42, pulse 66, respirations 16, 95%  saturated on room air.  She is a mildly obese female who appears her stated  age.  She is oriented x3.  Her speech is clear.  She follows commands  without any difficulty.  Her affect is bright.  She is smiling.  She is  alert, cooperative.   She transitions from sit to stand without any problems.  Her gait is stable,  non-antalgic today.  She has limitations in lumbar motion in all planes.  Romberg test is negative.  Tandem gait is performed quite well.  Seated  reflexes are 2+ at the patellar and Achilles tendons.  Her motor strength in  the lower extremities is a 5/5 range.   IMPRESSION:  1. Status post lumbar surgical procedure, Dr. Jeral Fruit,  1992.  2. Spinal stenosis-like symptoms with diminished capacity to ambulate and      right lower extremity pain, improved with Neurontin over the last      month.  3. Right trochanteric bursitis which is overall improved.   PROBLEM LIST:  Medical problems include the following:  1. COPD.  2. Questionable right kidney function.  3. History of anxiety and depression.  4. History of carpal tunnel syndrome.   PLAN:  Will continue the patient on Neurontin.  She does not have any side  effects from it.  No balance problems.  She does not feel too sedated.  Will  refill her Percocet today 7.5/325 one p.o. b.i.d. to q.i.d., #100.   At the last visit, we also had discussed the possibility of trialing  epidural injections.  However, at this point she states that she is quite  happy with her ability to walk a little further since she has started using  Neurontin and she has decreased leg pain.  Will see her back in two months.  Will have a nursing visit next month.  Will plan to refill her  Percocet and her Neurontin at that time.  We did discuss that she could take  an extra dose of Neurontin if she feels the need from it, bringing  her from  three times a day dosing to four times a day of 300 mg.           ______________________________  Brantley Stage, M.D.     DMK/MedQ  D:  08/19/2006 10:29:53  T:  08/19/2006 10:43:03  Job #:  [id number]   cc:   Prescott Parma  Fax: [phone]

## 2011-02-23 NOTE — Assessment & Plan Note (Signed)
Susan Fowler is a 60 year old white female who is a patient of Dr. Dyann Ruddle.  She has been seen in our [facility] Rehabilitative Clinic for chronic low back  pain, radiating right lower extremity pain, diminished ability to ambulate  more than 60 minutes at a time.  She currently can walk about 10 minutes.   At last visit, a trochanteric bursitis injection was done on the right.  She  states she got significant relief from the injection which is still  persisting to this visit.  Her average pain is about a 7 to 8 on a scale to  10, mainly located in the low back radiating down the right lower extremity.  Sleep is fair.  She gets good relief with her medications.  She states she  has started smoking again, is up to three packs a day.  She is attempting to  lose some weight.   On exam today, her blood pressure is 88/50, pulse 78, respirations 16, 95%  saturating on room air.  She is obese female who appears her stated age.  She does not appear in any distress.  She is oriented x3.  Affect is bright  and alert.  She is cooperative and pleasant.  She transitions from sit to  stand without any difficulty.  Gait is stable.  Balance is good.  She has  decreased range of motion especially in extension, also limitations in  forward flexion.  Her reflexes are 2+ at the patellar and Achilles tendon.  Motor strength is good throughout.  No focal weakness is appreciated.  No  tenderness noted over the trochanteric bursa today.   IMPRESSION:  1. Status post lumbar surgical procedure in 1992 by Dr. Jeral Fruit.  2. Intermittent neuropathic pain on the lower extremity with stenosis-like      symptoms.  3. Trochanteric bursitis, overall improved.  4. Chronic obstructive pulmonary disease, currently smoking three packs a      day.  5. Questionable right kidney function.  6. History of anxiety and depression.  7. History of carpal tunnel surgery.   PLAN:  We will go ahead and refill her Percocet today.  I am  switching her  from 5/500 two q.i.d. down to Percocet 7.5/325 mg 1 p.o. b.i.d. to q.i.d.  #120.  She feels that this is managing her pain fairly well.  We will  continue to monitor her medication usage closely.  I will see her back in a  month.  She continues to stay quite functional independent with self care  and able to play somewhat with her grandchildren.  Ambulation is somewhat  limited after 10 minutes, however.           ______________________________  Brantley Stage, M.D.     DMK/MedQ  D:  05/20/2006 12:24:15  T:  05/20/2006 21:28:05  Job #:  [id number]

## 2011-02-23 NOTE — Assessment & Plan Note (Signed)
Susan Fowler is a 60 year old female who is a patient of Dr. Dyann Ruddle.  She  has been seen in our pain and rehabilitative clinic for predominantly  complaints of low back pain and radiating right lower extremity pain  which is consistent with spinal stenosis and occasional right  trochanteric bursitis.   She is back in today.  She states her average pain is about a 6 on a  scale of 10.  She states she has been loosing weight, approximately 10  pounds.  She is watching her diet and starting to be more active,  walking every day.  The pain is typically worse with walking, sitting,  and standing.  It improves with heat and medications.   She gets good relief with the Percocet that she has been taking.  She  takes up to 2-3 Percocet per day, 5/325.   She is able to walk up to 30 minutes at a time now.  She is independent  with her self care and higher level household activities, denies  problems controlling bowel or bladder, denies depression, anxiety,  suicidal ideation, does admit to some intermittent constipation which  she controls with stool softener.   No changes in past medical, social, or family histories since our last  visit.  She was last seen on August 19, 2006.  She has had two nursing  visits in the interim for refill of medications.   MEDICATIONS:  Provided by this clinic include:  1. Neurontin 300 mg, one p.o. t.i.d.  She states that she has      discontinued this because Neurontin gave her nightmares and she      felt more forgetful on this medication.  2. She continues to take Percocet 7.5/325, two to four times per day.      Denies any problems with this medication.   PHYSICAL EXAMINATION:  VITAL SIGNS:  Blood pressure is 191/43, pulse 78,  respirations 16, 97% saturated on room air.  GENERAL:  She is obese, white female who appears her stated age and does  not appear in any distress in our room today.  MENTAL STATUS:  She is oriented x3.  Her speech is clear.  Her  affect is  bright, alert.  She is cooperative and pleasant, rather talkative today.  She follows commands without difficulty.  MUSCULOSKELETAL:  Transitioning from sit-to-stand is independent.  No  pain behavior is displayed.  Gait is normal.  Romberg test and tandem  gait are performed adequately.  She has some limitations with lumbar  extension.  She has good forward flexion.  Lateral flexion is just  mildly limited.  She does not complain of any pain with these movements  today.  Reflexes in the lower extremities are symmetric and intact.  Motor strength is good in both lower extremities.  No focal weakness is  appreciated.  No new numbness is appreciated.  Straight leg raise is  negative.   IMPRESSION:  1. Status post lumbar surgery, Dr. Jeral Fruit, 1992.  2. Spinal stenosis like symptoms with diminished capacity to ambulate,      intermittent right lower extremity pain.  3. Right trochanteric bursitis which waxes and wanes in its intensity.  4. Chronic obstructive pulmonary disease.  5. Questionable right kidney function.  6. History of anxiety, depression.  7. History of carpal tunnel syndrome.   PLAN:  1. We will not restart Neurontin.  She states she has been doing fine      off of it now.  2. She has increased her activity level, is loosing weight, feels her      pain is well controlled taking up to 2-4 Percocet per day.  She      continues to use stool softener for constipation.  She has been      able to maintain her      function on this medication.  She does not display any aberrant      behavior, and she is getting good relief with it.  3. We will see her back in 3 months, nursing visit in the interim.           ______________________________  Brantley Stage, M.D.     DMK/MedQ  D:  11/15/2006 11:06:27  T:  11/15/2006 13:07:20  Job #:  [id number]

## 2011-02-23 NOTE — Cardiovascular Report (Signed)
NAME:  Susan Fowler, Susan Fowler                       ACCOUNT NO.:  [id number]   MEDICAL RECORD NO.:  [id number]                   PATIENT TYPE:  INP   LOCATION:  3735                                 FACILITY:  [facility]   PHYSICIAN:  Veneda Melter, M.D.                   DATE OF BIRTH:  Jan 13, 1951   DATE OF PROCEDURE:  06/04/2003  DATE OF DISCHARGE:                              CARDIAC CATHETERIZATION   PROCEDURES PERFORMED:  1. Left heart catheterization.  2. Left ventriculogram.  3. Selective coronary angiography.  4. Failed Perclose right femoral artery.   DIAGNOSES:  1. Mild to moderate coronary artery disease.  2. Normal left ventricular systolic function.   HISTORY:  [name] is a 60 year old white female who presents with  substernal chest discomfort and dyspnea on exertion.  The patient underwent  stress imaging study showing ischemia in the basal inferior wall.  She had  recurrence of symptoms prompting admission to the hospital but she ruled out  for acute myocardial infarction and she presents for further assessment.   TECHNIQUE:  Informed consent was obtained.  The patient was brought to the  catheterization laboratory.  A 6-French sheath placed in the right femoral  artery using modified Seldinger technique.  A 6-French JL4 and JR4 catheter  was then used to engage the left and right coronary arteries.  Selective  angiography performed in various projections using manual injections of  contrast.  A 6-French pigtail catheter was then advanced in the left  ventricle and left ventriculogram performed using power injections of  contrast.  At the termination of this case a Perclose suture closure device  was deployed to the right femoral artery.  Due to persistent oozing, manual  pressure was then applied until adequate hemostasis achieved.  The patient  tolerated procedure well and was transferred to floor in stable condition.   FINDINGS:  1. Left main trunk:  Medium caliber  vessel.  Mild distal taper of 20%.  2. LAD is a medium caliber vessel that provides three small diagonal     branches and tapers prior to reaching the apex.  LAD has moderate     irregularities of 20% in the proximal segment with further narrowing of     40% with mild tortuosity in the mid section.  The diagonal branches had     mild irregularities.  3. Left circumflex artery is a medium caliber vessel and provides three     marginal branches in the mid section.  The AV circumflex has mild disease     of 30%.  4. Right coronary artery is dominant.  This is a medium caliber vessel that     provides a posterior descending artery in its terminal segment.  The     right coronary has mild irregularities  5. LV:  Normal end-systolic and end-diastolic dimensions.  Overall left     ventricular function is well  preserved.  Ejection fraction greater than     55%.  No mitral regurgitation.  LV pressure is 120/7.  Aortic is 120/70.     LVEDP equals 12.   ASSESSMENT/PLAN:  [name] is a 60 year old female with noncritical  coronary artery disease and well preserved left ventricular function.  Continued medical therapy will be recommended.  She will be placed on proton  pump inhibitor for six weeks at a time and reassessment made in the clinic  at which point a decision can be made if further work-up is necessary.  I  have encouraged the patient to exercise in order to improve coronary flow as  well as collaterals.  I believe that she would benefit from Statin therapy  for stabilization of plaque.                                               Veneda Melter, M.D.    Melton Alar  D:  06/04/2003  T:  06/04/2003  Job:  [id number]

## 2011-02-23 NOTE — Assessment & Plan Note (Signed)
Susan Fowler is a 60 year old white female who is a patient of Dr. __________ .  She is being seen in our pain and rehabilitative clinic for chronic low back  pain, radiating right lower extremity pain, and diminished ability to  ambulate more than 2-3 minutes at a time.   She states her average pain is about an 8 on a scale of 10.  Currently in  the office while sitting, it is about a 4.  The pain is described as fairly  constant, waxes and wanes depending upon activity level.  Worse with  walking, standing, laying flat.  It improves with rest.  She gets good  relief with the current meds that she is taking.   For pain management, she takes Percocet 5/325, 8 tablets per day for a total  of 2600 mg of acetaminophen per day.   She states she has been on nonsteroidals in the past, and she has had a  gastric ulcer.  She is independent with her self care, needs some assistance  with shopping, admits to some depression.  Denies suicidal ideation.  Admits  to some weight gain.   Past medical, social, family history:  Unchanged since last visit.   PHYSICAL EXAMINATION:  VITAL SIGNS:  Blood pressure 104/63, pulse 110,  respirations 16, 99% saturated on room air.  GENERAL:  She is an obese white female who appears her stated age.  She is  oriented x3.  Her affect is alert.  She is bright, cooperative, and  pleasant.  MUSCULOSKELETAL:  She transitions from sit-to-stand without much difficulty.  Her gait in the room is non-antalgic.  She does appear somewhat stiff.  Her  lumbar range is limited especially in extension.  Also complains of pain  with lateral flexion to the right and left.  Seated reflexes are 2+ at the  patellar and Achilles tendons.  Straight leg raise is negative.  She denies  any sensory deficits to pinprick or light touch today.  Her motor strength  reveals decreased EHL strength on the right compared to the left, otherwise  5/5 in lower extremities.   Radiographs which were  done last time were negative for fracture or other  abnormality.  No evidence of instability on flexion extension films.  She  does have a mild dextroscoliosis, multi-level degenerative changes were  noted, bilateral facet degenerative hypertrophy noted at L3-4, L4-5, L5-S1,  narrowing of the inner spaces at L2-3 and L4-5.   IMPRESSION:  1.  Status post lumbar surgical procedure, 1992, Dr. Jeral Fruit.  I do not have      notes regarding this or operative notes to suggest what particular      procedure was done at this time.  2.  Intermittent neuropathic pain, right lower extremity consistent with      stenosis like symptoms.  3.  Trochanteric bursitis, iliotibial band syndrome.  4.  History of chronic obstructive pulmonary disease.  5.  History of questionable right kidney function.  6.  History of anxiety/depression.  7.  History of carpal tunnel surgery.   PLAN:  1.  Discussed medications at length with the patient.  She understands that      the medications she is taking 8 times a day gives her a total dose of      2600 mg of acetaminophen.  Long term I would like to see her under 2,000      mg.  We discussed possibly adding a longer acting narcotic such as MS  Contin, may also consider Lyrica with her, however, would like to obtain      lumbar MRI, consider epidural steroid injection.  If she does well with      this, it may be that we can decrease her narcotic load quite a bit with      just the epidural steroid injections.  It is possible she may also      benefit from medial branch blocks in the future as well.  This was      discussed with her at length.  She brings in her Percocet today.  She      states she had it filled March 18, 2006, which was about 9 days ago.  She      does not need refills on her pain medication at this time.  2.  Call to Dr. __________ this afternoon, discussed her case with him      regarding her narcotic use.  He is in agreement to consider  possible      epidural steroids with her to possibly decrease her narcotic load as      well.  3.  Await MRI results.  4.  We will see Susan Fowler back in 1 month.           ______________________________  Brantley Stage, M.D.     DMK/MedQ  D:  03/27/2006 15:05:31  T:  03/27/2006 19:27:00  Job #:  [id number]

## 2011-02-23 NOTE — Procedures (Signed)
NAMEANELIA, Susan Fowler             ACCOUNT NO.:  [id number]   MEDICAL RECORD NO.:  [id number]          PATIENT TYPE:  OUT   LOCATION:  MRI                          FACILITY:  [facility]   PHYSICIAN:  Brantley Stage, M.D.DATE OF BIRTH:  1951/02/04   DATE OF PROCEDURE:  04/24/2006  DATE OF DISCHARGE:  03/29/2006                                 OPERATIVE REPORT   [name] is a 60 year old white female who is a patient of Dr. __________.  She was recently seem by me on April 22, 2006.  She is in today for an  injection to the right trochanteric bursal region.   Procedure was explained to her.  Alternatives were also explained to her.  Benefits would include decrease in right lateral hip pain.  Risks include  infection, bleeding, allergic reaction, increased pain, nerve damage,  bruising.   She states she would like to proceed.  Kenalog 2 cc mixed with 4 cc of 1%  lidocaine were injected into the right trochanteric region.  Sterile  technique was used.  Area was cleaned with Betadine and alcohol swabs.  She  tolerated the injection well.  Reported slightly decrease in pain directly  after the procedure.   Instructions were given for the next 48 hours.  She was discharged in good  condition.           ______________________________  Brantley Stage, M.D.     DMK/MEDQ  D:  04/24/2006 16:05:04  T:  04/24/2006 16:10:96  Job:  [id number]

## 2011-02-23 NOTE — Consult Note (Signed)
[facility]  Patient:    Susan Fowler, Susan Fowler Visit Number: [id number] MRN: [id number]          Service Type: PMG Location: [facility] Attending Physician:  Sondra Come Dictated by:   Sondra Come, D.O. Proc. Date: 03/30/02 Admit Date:  03/27/2002   CC:         Zena Amos, M.D., [address] [place] [place], [place]   Consultation Report  NEW PATIENT CONSULTATION  REFERRING Shacora Zynda: Dr. Zena Amos [address] [place], [place]  Dear Dr. Virgina Organ,  Thank you very much for kindly referring the patient to the [facility] for evaluation.  She was seen in our clinic today. Please refer to the following for details regarding the history, physical examination and treatment plan.  Once again, thank you for allowing to participate in the care of the patient.  CHIEF COMPLAINT:  Low back pain, bilateral hand pain and to discuss medications.  HISTORY OF PRESENT ILLNESS:  The patient is a 60 year old left-hand-dominant female who has had a long history of low back pain.  She is status post lumbar surgery x3 per her report.  She states she underwent two surgeries in 1990, one being an L4-5 diskectomy, unsure of the second surgery.  I gathered information from her disability forms.  On these forms, they only mentioned two surgeries; the second one mentioned was an L4 through S1 fusion in 1992. Since that time, she has been followed by her primary care Sarath Privott, Dr. Excell Seltzer, who is now retired, in [place], [place].  She was being treated with Percocet 5/325 mg every four hours.  She brings an old pill bottle for 220 pills per month.  By my calculation, this is greater than seven pills per day on a monthly basis.  Currently, she complains of pain in her low back, mainly on the right side, radiating into her right buttock, to her right posterolateral thigh and her leg.  She does have associated numbness  and paresthesias in a diffuse distribution in her right lower extremity.  She has had physical therapy, multiple lumbar epidural steroid injection.  She states she was followed by the pain clinic at [facility] in the 1990s.  She self-treats with a heating pad.  She denies any bowel and bladder dysfunction.  Denies fevers, chills, night sweats or weight loss.  In terms of medication, she has tried Ultram and anti-inflammatory medication, which she is unable to tolerate.  Ultram and Ultracet did not give her any pain relief.  She has taken amitriptyline in the past for sleep without any significant improvement. In addition, she complains of bilateral hand pain, stating that she had carpal tunnel release x2.  Currently, the pain in her hands is associated with numbness and paresthesias mainly in the thumbs, ring fingers and small fingers.  She does wear splints on occasion without any significant improvement.  Her pain today is described as horrible, greater than 10/10 on a subjective scale, constant.  Her function and quality-of-life indices have declined.  Her sleep is fair.  Her pain is improved to a 4/10 on a subjective scale with medications.  In addition to the Percocet, she does bring an empty bottle of Fioricet which she states she previously took for headaches; she also states that she takes Ativan 1 mg to replace previous prescription for Xanax, which was being provided by Dr. Excell Seltzer for reasons which are uncertain to the patient, although she stated that the "  doctor told me he didnt want me to be a basket case."  She admits to some anxiety.  She has never had a psychologic evaluation.  I review the health and history form and 14-point review of systems.  The patients pain is worse with bending and improved with rest, heat and medications.  PAST MEDICAL HISTORY:  Asthma, anxiety and GI ulcers.  PAST SURGICAL HISTORY:  Carpal tunnel release x2 bilaterally, lumbar surgery x3 with L4  through S1 fusion and urethral transplant, hysterectomy, hemorrhoidectomy.  FAMILY HISTORY:  Lung cancer, diabetes, hypertension.  SOCIAL HISTORY:  The patient smokes two to three cigarettes per day.  She denies alcohol use.  She denies illicit drug use.  She is divorced and currently on disability.  ALLERGIES:  SULFA.  MEDICATIONS: 1. Percocet 5/325 mg six pills per day per the patients report. 2. Ativan 1 mg three times daily. 3. Tylenol PM as needed. 4. Previous use of Fioricet for headaches.  PHYSICAL EXAMINATION:  GENERAL:  Physical examination reveals a healthy female in no acute distress.  VITAL SIGNS:  Blood pressure is 137/76, pulse 94, respirations 16. O2 saturation is 98% on room air.  BACK:  Examination of the back reveals slightly inferior right hemipelvis without scoliosis, decreased lumbar lordosis with a healed midline incisional scar.  Palpatory examination reveals tenderness to palpation in bilateral lumbar paraspinals.  Range of motion is limited in all planes secondary to pain discomfort.  NEUROMUSCULAR:  Manual muscle testing is 5/5, bilateral upper and lower extremities, in all muscle groups tested.  There is no atrophy noted in the upper and lower extremities grossly.  Sensory examination reveals decreased light touch diffusely in the right lower extremity.  There is a tingly sensation in the thumbs, ring fingers and small fingers, with absent sensation in the index fingers and middle fingers bilaterally.  Muscle stretch reflexes are 2+/4, bilateral biceps, triceps, brachioradialis, pronator teres, patellar, medial hamstrings and Achilles.  Straight leg raise is negative bilaterally.  FABER is negative bilaterally.  Spurlings maneuver is negative bilaterally but causes some mild pain in the base of the neck.  Tinels is negative over the median and ulnar nerves at the wrists bilaterally.  Tinels is positive over the ulnar nerves at the elbows  bilaterally.  IMPRESSION: 1. Chronic low back pain, status post lumbar surgery x3.  2. Degenerative disk disease of the lumbar spine. 3. Status post carpal tunnel release x2. 4. 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 5. History of anxiety. 6. Narcotic habituation.  PLAN: 1. I had a long discussion with the patient regarding treatment options.  At    this point, I think it is reasonable to transition her to a long-acting    narcotic-based pain medication to assist with overall functional    improvement and pain control as she has taken long-term short-acting    medications.  I will transition her to Duragesic 25 mcg q.72h. and this was    done with full disclosure.  The patient denies risk to harm herself or    others.  Nursing gave instruction. 2. Will start Zonegran 100 mg one p.o. q.d. for neuropathic component as well    as for possible cubital tunnel syndrome, samples provided, #14. 3. Will obtain urine drug screen with full informed consent. 4. The patient is to continue Ativan per primary care Ica Daye.  Would suggest    psychiatric or psychologic evaluation. 5. The patient is to return to clinic in  two weeks for reevaluation.  The patient was educated in the above findings and recommendations and understands.  There were no barriers to communication. Dictated by:   Sondra Come, D.O. Attending Physician:  Sondra Come DD:  03/30/02 TD:  03/31/02 Job: [id number]

## 2011-02-23 NOTE — Assessment & Plan Note (Signed)
Susan Fowler is a 60 year old white female who is a patient of Dr. Virgina Fowler.  She is being seen in our pain and rehabilitative clinic for chronic low back  pain, radiating right lower extremity pain, diminished ability to ambulate  more than 2-3 minutes at a time.   She states she can walk about 10 minutes at this point.  Her average pain is  about an 8 on a scale of 10.  She states she gets good relief with the  current meds that she is on.  On further questioning regarding epidurals,  she states she has had two sets of three epidurals in the past which have  not been particularly helpful.  Her predominant pain is located in the low  back and radiates down the leg.  She also has an area which she cannot lie  on at night, over the right trochanter.   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   There have been no significant changes in her past medical, social or family  history since her last visit.  She reports overall good sleep.  She  describes her pain as sharp.   PHYSICAL EXAMINATION:  Blood pressure 91/48, pulse 76, respirations 16, 96%  saturated on room air.  She is a mildly obese female who appears her stated  age.  She does not appear in any distress.  She is oriented times three.  Her affect is bright and alert.  She is cooperative and pleasant.   Transitions from sit to stand without difficulty.  She has slightly antalgic  gait.  Limitations in lumbar motion are noted.  Reflexes otherwise 2+ at the  patellar tendons and Achilles tendons.  Straight leg raise is negative.  She  denies sensory deficits to light touch.  Intact proprioception is noted.  Slightly decreased EHL on the right compared to the left.   Otherwise no focal abnormalities on manual muscle testing are observed.   IMPRESSION:  1.  Status post lumbar surgical procedure, 1992, Dr. Jeral Fowler.  2.  Intermittent neuropathic  pain, right lower extremity, with stenosis-like      symptoms.  3.  Trochanteric bursitis.  4.  Chronic obstructive pulmonary disease.  5.  Questionable right kidney function.  6.  History of anxiety and depression.  7.  Carpal tunnel surgery.   PLAN:  I reviewed a variety of pain management options with her.  At this  point she is not interested in anything invasive, which would include lumbar  epidurals or medial branch blocks to help her with her low back pain or leg  pain.   She states that her function is quite good when she is medicated with  narcotics.  She states the main time she needs medication is especially on  the weekends when she is trying to do things with her grandchildren or  taking longer walks, possibly going to the farmers market.   She states she would probably be able to get by with less narcotic  medication during the week, medicating up to twice a day, if she could  increase her dose slightly, medicating four times a day on the weekends.  Over the next month she will attempt to take her medication as I just  reiterated.  We will see her back in a month.           ______________________________  Susan Fowler, M.D.  DMK/MedQ  D:  04/22/2006 16:31:20  T:  04/23/2006 00:42:48  Job #:  [id number]   cc:   Prescott Parma  Fax: [phone]

## 2011-02-23 NOTE — Discharge Summary (Signed)
NAME:  Susan, Fowler                       ACCOUNT NO.:  [id number]   MEDICAL RECORD NO.:  [id number]                   PATIENT TYPE:  INP   LOCATION:  3735                                 FACILITY:  [facility]   PHYSICIAN:  Susan Fowler, M.D.                   DATE OF BIRTH:  11-12-1950   DATE OF ADMISSION:  06/03/2003  DATE OF DISCHARGE:  06/04/2003                           DISCHARGE SUMMARY - REFERRING   SUMMARY OF HISTORY:  Susan Fowler is a 59 year old white female who was  admitted by Dr. Olga Fowler.  She described a six-month history of  intermittent chest discomfort in her left lateral chest.  It was not  exertional nor pleuritic or related to food.  It did radiate to the left  shoulder and left upper extremity but unassociated with nausea and vomiting.  She occasionally had some shortness of breath and diaphoresis.  A Cardiolite  on April 09, 2003 interrupted by Dr. Jonelle Sidle showed an EF of 62%  and ischemia in the inferobasilar wall; she was referred for further  evaluation.  Her last episode of chest discomfort was on the medications on  admission.  Dr. Jens Fowler felt that her chest discomfort was atypical but  with her risk factors of hypertension, hyperlipidemia, tobacco use, PUD and  Bright's disease, it was felt that she should undergo cardiac  catheterization to further evaluate her discomfort.   LABORATORY DATA:  TSH was 0.540.  B12 297, RBC folate 314.  Lipids showed a  total cholesterol of 189, triglycerides 531, HDL 44; LDL was not calculated.  CK total, MBs and troponins were negative for myocardial infarction.  Admission sodium was 143, potassium 4.1, BUN 14, creatinine 0.8, glucose  100.  Normal LFTs.  PT 11.7, PTT 25.  D-dimer was 0.7.  H&H 11.3 and 32.3,  MCV was slightly elevated at 104.4, platelets 245,000, WBC 6.4.   Chest x-ray did not show ay active disease.  She had chronic bronchitic  changes.   EKG showed normal sinus rhythm, normal axis,  early R waves, nonspecific ST-T  wave changes.   HOSPITAL COURSE:  Overnight, she continued to have intermittent chest  discomfort, however, enzymes and EKGs were negative for a myocardial  infarction.  Zocor was started for her hyperlipidemia.  Catheterization  performed on June 04, 2003 by Dr. Chales Fowler revealed a 20% left main, 20%  proximal LAD, 40% mid LAD with three small diagonals.  She also has a 30%  proximal circumflex.  EF was 55%.  Dr. Chales Fowler felt that she had noncritical,  nonobstructive coronary artery disease and recommended continued cardiac  risk factor modification.  He recommended proton pump inhibitor and followup  with [facility].  Post sheath removal and bedrest, the patient was ambulating  without difficulty, thus she was discharged to home.   DISCHARGE DIAGNOSES:  1. Noncardiac chest discomfort.  2  Hyperlipidemia.   DISPOSITION:  She  is discharged home.   MEDICATIONS:  1. Paxil CR 25 mg daily.  2. Aspirin 325 mg daily.  3. Percocet two tabs three times daily (t.i.d.).  4. Flexeril 10 mg t.i.d.  5. She was given a new prescription for Zocor 40 mg nightly.   SPECIAL DISCHARGE INSTRUCTIONS:  She was advised no lifting, driving, sexual  activity or heavy exertion x2 days, maintain low-salt/-fat/-cholesterol  diet.  If she had any problems with her catheterization site, she was asked  to call immediately.  She was advised no smoking or tobacco products.   FOLLOWUP:  She will follow up with Dr. Jens Fowler in [place] on June 24, 2003 at 10 a.m.  She will need fasting lipids and LFTs in approximately six  to eight weeks, since Zocor was initiated.  She declined proton pump  inhibitor at discharge.      Susan Fowler, P.A. [facility]                    Susan Fowler, M.D.    EW/MEDQ  D:  07/05/2003  T:  07/05/2003  Job:  [id number]   cc:   Susan Fowler, M.D.   [address], [address], [place] [address] [facility]   [place], [place] Susan Fowler

## 2011-02-23 NOTE — H&P (Signed)
NAME:  Susan Fowler, Susan Fowler                       ACCOUNT NO.:  [id number]   MEDICAL RECORD NO.:  [id number]                   PATIENT TYPE:  INP   LOCATION:  1823                                 FACILITY:  [facility]   PHYSICIAN:  Olga Millers, M.D.                DATE OF BIRTH:  Sep 06, 1951   DATE OF ADMISSION:  06/03/2003  DATE OF DISCHARGE:                                HISTORY & PHYSICAL   REASON FOR ADMISSION:  Susan Fowler is a 60 year old female with a past medical  history of hypertension, hyperlipidemia, tobacco abuse, peptic ulcer  disease, Bright's disease who presents with chest pain.  The patient states  that she was told she had a myocardial infarction approximately ten years  ago following back surgery but I do not have any of those records available  to me.  She did not undergo further cardiac testing, by report.  Over the  past six months, she complained of intermittent chest pain.  It was in the  left lateral chest area and described as a sharp sensation.  It is not  exertional nor is it pleuritic or related to food.  It can radiate to the  left shoulder and left upper extremity.  There are no exacerbating or  relieving factors.  There typically is not associated nausea and vomiting  but there is shortness of breath and diaphoresis.   The patient did have a Cardiolite on April 09, 2003, that was interpreted by  Dr. Nona Dell.  She was noted to have an ejection fraction of 62% and  there was ischemia in the inferobasal wall.  She was seen by Dr. Donia Ast  today and we were asked to further evaluate.  I know that she did have chest  pain this morning for approximately two hours.   ALLERGIES:  She is allergic to SULFA and TAPE.   MEDICATIONS:  1. Paxil CR 25 mg p.o. daily.  2. Aspirin.  3. Percocet two tablets t.i.d.  4. Flexeril 10 mg t.i.d.   SOCIAL HISTORY:  She does have a 120-pack history of tobacco use.  She does  not consume alcohol.   FAMILY HISTORY:   Positive for coronary artery disease in her mother.   PAST MEDICAL/SURGICAL HISTORY:  She does have hypertension, hyperlipidemia.  There is no diabetes mellitus.  She states she has a history of  hyperthyroidism that was treated.  She has a history of bronchitis.  She  also has a history of peptic ulcer disease.  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  She has had three back surgeries.  She is  status post hysterectomy.  She has had carpal tunnel surgery.   REVIEW OF SYSTEMS:  She does occasionally have headaches.  There is no  productive cough or hemoptysis.  She does occasionally have dysphagia.  There is no melena or hematochezia.  There is no dysuria or hematuria.  There is no rash or seizure activity.  She does use CPAP.  She does  occasionally have orthopnea as well as PND and intermittent pedal edema.  Remaining systems are negative.   PHYSICAL EXAMINATION:  VITAL SIGNS:  Blood pressure 140/80 in the right arm  and 140/78 in the left, pulse 115, weight 211 pounds.  GENERAL:  She is well developed and somewhat anxious.  She is in no acute  distress at the present.  SKIN:  Warm and dry. There is no peripheral clubbing.  HEENT:  Unremarkable with normal eyelids.  NECK:  Supple with a normal upstroke bilaterally and there are no bruits  noted.  There is no jugular venous distention or thyromegaly noted.  CHEST:  Clear to auscultation.  Normal expansion.  CARDIOVASCULAR:  Reveals a regular rate and rhythm with a normal S1 and S2.  There are no murmurs, rubs, or gallops noted.  ABDOMEN:  Not tender or distended.  Positive bowel sounds.  No  hepatosplenomegaly and no masses appreciated.  There is no abdominal bruit.  Of note, she does have some tenderness in the left lateral chest area.  PERIPHERAL PULSES:  She has 2+ femoral pulses bilaterally and no bruits.  EXTREMITIES:  Show no edema and I  can palpate no cords.  She has 2+ dorsalis  pedis pulses bilaterally.  NEUROLOGICAL:  Grossly intact.   DIAGNOSTIC STUDIES:  Electrocardiogram shows a sinus tachycardia at a rate  of 115.  The axis is normal.  There are nonspecific ST changes.   DIAGNOSES:  1. Atypical chest pain.  2. Recent abnormal nuclear study.  3. Hypertension.  4. Hyperlipidemia.  5. History of tobacco use.  6. 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  7. History of peptic ulcer disease.   PLAN:  [name] presents with chest pain that is somewhat atypical.  However, she does have multiple risk factors and also had recent abnormal  nuclear study.  There is also a questionable history of myocardial  infarction.  I think it would be most prudent to admit her to [facility] to exclude myocardial infarction with serial enzymes.  The risks  and benefits of catheterization have been discussed with Susan Fowler and she  agrees to proceed.  We will continue with her aspirin and I will add a low-  dose beta-blocker as well as IV heparin.  We will also check a D-dimer.  We  will make further recommendations once we have the above information.                                               Olga Millers, M.D.    BC/MEDQ  D:  06/03/2003  T:  06/03/2003  Job:  [id number]

## 2011-07-13 LAB — CBC
Hemoglobin: 12.6 g/dL (ref 12.0–15.0)
MCV: 99.3 fL (ref 78.0–100.0)
RBC: 3.7 MIL/uL — ABNORMAL LOW (ref 3.87–5.11)

## 2011-07-13 LAB — BASIC METABOLIC PANEL
BUN: 13 mg/dL (ref 6–23)
BUN: 6 mg/dL (ref 6–23)
Calcium: 8.3 mg/dL — ABNORMAL LOW (ref 8.4–10.5)
Chloride: 102 mEq/L (ref 96–112)
Chloride: 104 mEq/L (ref 96–112)
Chloride: 90 mEq/L — ABNORMAL LOW (ref 96–112)
Creatinine, Ser: 1.02 mg/dL (ref 0.4–1.2)
GFR calc Af Amer: >60 mL/min (ref >60)
GFR calc non Af Amer: 56 mL/min — ABNORMAL LOW (ref >60)
GFR calc non Af Amer: >60 mL/min (ref >60)
Glucose, Bld: 107 mg/dL — ABNORMAL HIGH (ref 70–99)
Glucose, Bld: 88 mg/dL (ref 70–99)
Glucose, Bld: 91 mg/dL (ref 70–99)
Potassium: 3.6 mEq/L (ref 3.5–5.1)
Potassium: 3.7 mEq/L (ref 3.5–5.1)
Potassium: 3.9 mEq/L (ref 3.5–5.1)

## 2011-07-13 LAB — TYPE AND SCREEN
ABO/RH(D): O POS
Antibody Screen: NEGATIVE

## 2011-07-13 LAB — ABO/RH: ABO/RH(D): O POS

## 2012-05-21 ENCOUNTER — Encounter: Payer: Self-pay | Admitting: Cardiology

## 2012-06-23 ENCOUNTER — Other Ambulatory Visit: Payer: Self-pay | Admitting: Cardiology

## 2012-06-23 ENCOUNTER — Encounter: Payer: Self-pay | Admitting: Cardiology

## 2012-06-23 ENCOUNTER — Ambulatory Visit (INDEPENDENT_AMBULATORY_CARE_PROVIDER_SITE_OTHER): Payer: Medicare Other | Admitting: Cardiology

## 2012-06-23 ENCOUNTER — Encounter: Payer: Self-pay | Admitting: *Deleted

## 2012-06-23 ENCOUNTER — Telehealth: Payer: Self-pay

## 2012-06-23 VITALS — BP 119/82 | HR 87 | Ht 69.0 in | Wt 219.8 lb

## 2012-06-23 DIAGNOSIS — I2510 Atherosclerotic heart disease of native coronary artery without angina pectoris: Secondary | ICD-10-CM

## 2012-06-23 DIAGNOSIS — F17200 Nicotine dependence, unspecified, uncomplicated: Secondary | ICD-10-CM

## 2012-06-23 DIAGNOSIS — Z720 Tobacco use: Secondary | ICD-10-CM | POA: Insufficient documentation

## 2012-06-23 DIAGNOSIS — R0602 Shortness of breath: Secondary | ICD-10-CM

## 2012-06-23 DIAGNOSIS — R072 Precordial pain: Secondary | ICD-10-CM | POA: Insufficient documentation

## 2012-06-23 DIAGNOSIS — I10 Essential (primary) hypertension: Secondary | ICD-10-CM | POA: Insufficient documentation

## 2012-06-23 DIAGNOSIS — E782 Mixed hyperlipidemia: Secondary | ICD-10-CM | POA: Insufficient documentation

## 2012-06-23 NOTE — Assessment & Plan Note (Signed)
Smoking cessation discussed 

## 2012-06-23 NOTE — Assessment & Plan Note (Signed)
She is on statin therapy, followed by Dr. Virgina Organ.

## 2012-06-23 NOTE — Telephone Encounter (Signed)
Lexiscan Cardiolite (stress test) Weight 219 Diagnosis  414.01, 786.51, 786.05  Thursday, September 19th, 2013 [facility]

## 2012-06-23 NOTE — Assessment & Plan Note (Signed)
Nonobstructive disease documented in 2004, with ongoing active cardiac risk factors including hypertension, hyperlipidemia, tobacco abuse. She is reporting intermittent chest pain symptoms with typical and atypical features. Recent ECG reviewed and nonspecific. She has had no interval ischemic evaluation, and therefore we will proceed with a followup Lexiscan Cardiolite. Office followup arranged to review.

## 2012-06-23 NOTE — Progress Notes (Signed)
Clinical Summary Susan Fowler is a 61 y.o.female referred for cardiology consultation by Dr. Virgina Organ. She has been seen by our practice in the past, underwent cardiac catheterization in 2004 that demonstrated only mild coronary atherosclerosis.  She reports a 9 month history of intermittent chest pain described as sharp discomfort in her upper left chest, sometimes in the left arm. Symptoms last for several minutes, usually moderate intensity. They have been more frequent in general.  She has ongoing cardiovascular risk factors outlined below including continued tobacco abuse. Recent ECG from August was reviewed showing nonspecific ST segment changes. She has not undergone any interval ischemic evaluation in the last 9 years.   No Known Allergies  Current Outpatient Prescriptions  Medication Sig Dispense Refill  . ALPRAZolam (XANAX) 0.5 MG tablet Take 0.5 mg by mouth 3 (three) times daily.       Marland Kitchen aspirin 81 MG tablet Take 81 mg by mouth daily.      . B Complex-C (SUPER B COMPLEX PO) Take 1 tablet by mouth daily.      . Cholecalciferol (VITAMIN D-3) 1000 UNITS CAPS Take 1,000 Units by mouth daily.      . cyclobenzaprine (FLEXERIL) 10 MG tablet Take 10 mg by mouth 3 (three) times daily.      Marland Kitchen diltiazem (DILACOR XR) 240 MG 24 hr capsule Take 240 mg by mouth daily.      . hydrochlorothiazide (HYDRODIURIL) 25 MG tablet Take 25 mg by mouth daily.      Marland Kitchen HYDROcodone-acetaminophen (VICODIN) 5-500 MG per tablet Take 1 tablet by mouth 4 (four) times daily.      . metoprolol (LOPRESSOR) 50 MG tablet Take 50 mg by mouth daily.       . Omega-3 Fatty Acids (FISH OIL) 1000 MG CAPS Take 1,000 mg by mouth daily.      . potassium chloride (K-DUR) 10 MEQ tablet Take 10 mEq by mouth daily.      . simvastatin (ZOCOR) 80 MG tablet Take 80 mg by mouth at bedtime.        Past Medical History  Diagnosis Date  . Essential hypertension, benign   . Mixed hyperlipidemia   . Peptic ulcer disease   . Bright's  disease     Reportedly urethral transplant, single functioning kidney  . Hyperthyroidism   . Coronary atherosclerosis of native coronary artery     Nonobstructive at catheterization 2004    Past Surgical History  Procedure Date  . Carpal tunnel release   . Lumbar spine surgery   . Abdominal hysterectomy   . Hemorrhoid surgery   . Urethral transplant     Family History  Problem Relation Age of Onset  . Diabetes type II    . Hypertension    . Cancer    . Coronary artery disease Mother     Social History Ms. Monje reports that she has been smoking Cigarettes.  She does not have any smokeless tobacco history on file. Ms. Mogel reports that she does not drink alcohol.  Review of Systems No palpitations or syncope. No reported bleeding episodes. No orthopnea or PND. Reports chronic problems with knee pain. No peripheral edema. Otherwise negative.  Physical Examination Filed Vitals:   06/23/12 1327  BP: 119/82  Pulse: 87   Filed Weights   06/23/12 1327  Weight: 219 lb 12.8 oz (99.701 kg)   Patient in no acute distress. HEENT: Conjunctiva and lids normal, oropharynx clear. Neck: Supple, no elevated JVP or carotid bruits, no  thyromegaly. Lungs: Clear to auscultation, diminished, nonlabored breathing at rest. Cardiac: Regular rate and rhythm, no S3 or significant systolic murmur, no pericardial rub. Abdomen: Soft, protuberant, bowel sounds present, no guarding or rebound. Extremities: No pitting edema, distal pulses 2+. Skin: Warm and dry. Musculoskeletal: No kyphosis. Neuropsychiatric: Alert and oriented x3, affect grossly appropriate.     Problem List and Plan   Coronary atherosclerosis of native coronary artery Nonobstructive disease documented in 2004, with ongoing active cardiac risk factors including hypertension, hyperlipidemia, tobacco abuse. She is reporting intermittent chest pain symptoms with typical and atypical features. Recent ECG reviewed and  nonspecific. She has had no interval ischemic evaluation, and therefore we will proceed with a followup Lexiscan Cardiolite. Office followup arranged to review.  Essential hypertension, benign Blood pressure is well controlled today.  Mixed hyperlipidemia She is on statin therapy, followed by Dr. Virgina Organ.  Tobacco abuse Smoking cessation discussed.    Jonelle Sidle, M.D., F.A.C.C.

## 2012-06-23 NOTE — Patient Instructions (Addendum)
   Lexiscan Cardiolite (stress test) Continue all current medications. Office will contact with results Follow up in  3 weeks

## 2012-06-23 NOTE — Assessment & Plan Note (Signed)
Blood pressure is well-controlled today. 

## 2012-06-24 NOTE — Telephone Encounter (Signed)
Auth # [id number] exp 08/08/12

## 2012-06-26 DIAGNOSIS — R079 Chest pain, unspecified: Secondary | ICD-10-CM

## 2012-06-27 ENCOUNTER — Telehealth: Payer: Self-pay | Admitting: *Deleted

## 2012-06-27 NOTE — Telephone Encounter (Signed)
Message copied by Eustace Moore on Fri Jun 27, 2012  4:18 PM ------      Message from: Jonelle Sidle      Created: Fri Jun 27, 2012 10:37 AM       Reviewed report. Mild abnormality at the base of the anterior wall - although ischemia possible in this area, this could also be due to attenuation artifact from breast tissue. Please make sure she has followup in the next few weeks, and we can decide if further evaluation is needed.

## 2012-06-27 NOTE — Telephone Encounter (Signed)
Patient informed. 

## 2012-07-16 ENCOUNTER — Ambulatory Visit: Payer: Medicare Other | Admitting: Cardiology

## 2012-08-12 ENCOUNTER — Ambulatory Visit (INDEPENDENT_AMBULATORY_CARE_PROVIDER_SITE_OTHER): Payer: Medicare Other | Admitting: Cardiology

## 2012-08-12 ENCOUNTER — Encounter: Payer: Self-pay | Admitting: Cardiology

## 2012-08-12 VITALS — BP 108/74 | HR 91 | Ht 65.0 in | Wt 220.8 lb

## 2012-08-12 DIAGNOSIS — I2510 Atherosclerotic heart disease of native coronary artery without angina pectoris: Secondary | ICD-10-CM

## 2012-08-12 MED ORDER — ISOSORBIDE MONONITRATE ER 30 MG PO TB24: 30.0000 mg | ORAL_TABLET | Freq: Every day | ORAL | Status: DC

## 2012-08-12 NOTE — Progress Notes (Signed)
Clinical Summary Susan Fowler is a 61 y.o.female presenting for followup. She was seen recently with history of chest pain, typical and atypical features in the setting of nonobstructive CAD at catheterization in 2004.  Followup Lexiscan Cardiolite demonstrated nondiagnostic ST segment changes with small reversible basal anterior defect, either representing area of ischemia or variable soft tissue attenuation. LVEF was 59%. We reviewed these results today and the possible implications.  We did discuss possibility of a diagnostic cardiac catheterization, although at this point she was most comfortable with observation and medical therapy. We discussed use of nitroglycerin, and arranging a close office followup visit.  No Known Allergies  Current Outpatient Prescriptions  Medication Sig Dispense Refill  . ALPRAZolam (XANAX) 0.5 MG tablet Take 0.5 mg by mouth 3 (three) times daily.       Marland Kitchen aspirin 81 MG tablet Take 81 mg by mouth daily.      . B Complex-C (SUPER B COMPLEX PO) Take 1 tablet by mouth daily.      . Cholecalciferol (VITAMIN D-3) 1000 UNITS CAPS Take 1,000 Units by mouth daily.      . cyclobenzaprine (FLEXERIL) 10 MG tablet Take 10 mg by mouth 3 (three) times daily.      Marland Kitchen diltiazem (DILACOR XR) 240 MG 24 hr capsule Take 240 mg by mouth daily.      . hydrochlorothiazide (HYDRODIURIL) 25 MG tablet Take 25 mg by mouth daily.      Marland Kitchen HYDROcodone-acetaminophen (VICODIN) 5-500 MG per tablet Take 1 tablet by mouth 4 (four) times daily as needed.       . metoprolol (LOPRESSOR) 50 MG tablet Take 50 mg by mouth daily.       . Omega-3 Fatty Acids (FISH OIL) 1000 MG CAPS Take 1,000 mg by mouth daily.      . potassium chloride (K-DUR) 10 MEQ tablet Take 10 mEq by mouth daily.      . simvastatin (ZOCOR) 80 MG tablet Take 80 mg by mouth at bedtime.      . isosorbide mononitrate (IMDUR) 30 MG 24 hr tablet Take 1 tablet (30 mg total) by mouth daily.  90 tablet  0    Past Medical History    Diagnosis Date  . Essential hypertension, benign   . Mixed hyperlipidemia   . Peptic ulcer disease   . Bright's disease     Reportedly urethral transplant, single functioning kidney  . Hyperthyroidism   . Coronary atherosclerosis of native coronary artery     Nonobstructive at catheterization 2004    Social History Ms. Dehnert reports that she has been smoking Cigarettes.  She does not have any smokeless tobacco history on file. Ms. Fujikawa reports that she does not drink alcohol.  Review of Systems No palpitations, dizziness, syncope. Otherwise negative.  Physical Examination Filed Vitals:   08/12/12 1519  BP: 108/74  Pulse: 91   Filed Weights   08/12/12 1519  Weight: 220 lb 12.8 oz (100.154 kg)   Patient in no acute distress.  HEENT: Conjunctiva and lids normal, oropharynx clear.  Neck: Supple, no elevated JVP or carotid bruits, no thyromegaly.  Lungs: Clear to auscultation, diminished, nonlabored breathing at rest.  Cardiac: Regular rate and rhythm, no S3 or significant systolic murmur, no pericardial rub.  Extremities: No pitting edema, distal pulses 2+.   Problem List and Plan   Coronary atherosclerosis of native coronary artery Chest pain symptoms with typical and atypical features, also in the setting of psychosocial stressors which she discussed  today. History of nonobstructive disease at catheterization remotely, recent Cardiolite showing a basal anterior defect, possibly ischemic, although unusual distribution for major epicardial vessel and could be related to variable soft tissue attenuation. Would recommend risk factor modification, smoking cessation. She wanted to see how her symptoms do with nitroglycerin and she will be placed on Imdur 30 mg daily. Followup is arranged in a month. We can discuss cardiac catheterization again if her symptoms progress.    Jonelle Sidle, M.D., F.A.C.C.

## 2012-08-12 NOTE — Patient Instructions (Addendum)
Your physician recommends that you schedule a follow-up appointment in: 1 month. Your physician has recommended you make the following change in your medication: start isosorbide mononitrate 30 mg daily. All other medications will remain the same. Your new prescription has been sent to your pharmacy.

## 2012-08-12 NOTE — Assessment & Plan Note (Signed)
Chest pain symptoms with typical and atypical features, also in the setting of psychosocial stressors which she discussed today. History of nonobstructive disease at catheterization remotely, recent Cardiolite showing a basal anterior defect, possibly ischemic, although unusual distribution for major epicardial vessel and could be related to variable soft tissue attenuation. Would recommend risk factor modification, smoking cessation. She wanted to see how her symptoms do with nitroglycerin and she will be placed on Imdur 30 mg daily. Followup is arranged in a month. We can discuss cardiac catheterization again if her symptoms progress.

## 2012-09-12 ENCOUNTER — Ambulatory Visit: Payer: Self-pay | Admitting: *Deleted

## 2012-09-17 ENCOUNTER — Ambulatory Visit: Payer: Medicare Other | Admitting: Cardiology

## 2012-11-13 ENCOUNTER — Other Ambulatory Visit: Payer: Self-pay | Admitting: Cardiology

## 2013-11-10 ENCOUNTER — Encounter (HOSPITAL_COMMUNITY): Payer: Self-pay | Admitting: Pharmacy Technician

## 2013-11-10 NOTE — Patient Instructions (Signed)
Your procedure is scheduled on:  11/16/2013  [facility]  [facility]    AM.  Call this number if you have problems the morning of surgery: [phone]   Remember:   Do not eat or drink :After Midnight.    Take these medicines the morning of surgery with A SIP OF WATER: Metoprolol, Diltiazem, and Isosorbide   Do not wear jewelry, make-up or nail polish.  Do not wear lotions, powders, or perfumes. You may wear deodorant.  Do not shave 48 hours prior to surgery.  Do not bring valuables to the hospital.  Contacts, dentures or bridgework may not be worn into surgery.  Patients discharged the day of surgery will not be allowed to drive home.  Name and phone number of your driver:    Please read over the following fact sheets that you were given: Pain Booklet, Surgical Site Infection Prevention, Anesthesia Post-op Instructions and Care and Recovery After Surgery  Cataract Surgery  A cataract is a clouding of the lens of the eye. When a lens becomes cloudy, vision is reduced based on the degree and nature of the clouding. Surgery may be needed to improve vision. Surgery removes the cloudy lens and usually replaces it with a substitute lens (intraocular lens, IOL). LET YOUR EYE DOCTOR KNOW ABOUT:  Allergies to food or medicine.   Medicines taken including herbs, eyedrops, over-the-counter medicines, and creams.   Use of steroids (by mouth or creams).   Previous problems with anesthetics or numbing medicine.   History of bleeding problems or blood clots.   Previous surgery.   Other health problems, including diabetes and kidney problems.   Possibility of pregnancy, if this applies.  RISKS AND COMPLICATIONS  Infection.   Inflammation of the eyeball (endophthalmitis) that can spread to both eyes (sympathetic ophthalmia).   Poor wound healing.   If an IOL is inserted, it can later fall out of proper position. This is very uncommon.   Clouding of the part of your eye that  holds an IOL in place. This is called an "after-cataract." These are uncommon, but easily treated.  BEFORE THE PROCEDURE  Do not eat or drink anything except small amounts of water for 8 to 12 before your surgery, or as directed by your caregiver.   Unless you are told otherwise, continue any eyedrops you have been prescribed.   Talk to your primary caregiver about all other medicines that you take (both prescription and non-prescription). In some cases, you may need to stop or change medicines near the time of your surgery. This is most important if you are taking blood-thinning medicine.Do not stop medicines unless you are told to do so.   Arrange for someone to drive you to and from the procedure.   Do not put contact lenses in either eye on the day of your surgery.  PROCEDURE There is more than one method for safely removing a cataract. Your doctor can explain the differences and help determine which is best for you. Phacoemulsification surgery is the most common form of cataract surgery.  An injection is given behind the eye or eyedrops are given to make this a painless procedure.   A small cut (incision) is made on the edge of the clear, dome-shaped surface that covers the front of the eye (cornea).   A tiny probe is painlessly inserted into the eye. This device gives off ultrasound waves that soften and break up the cloudy center of the lens. This makes  it easier for the cloudy lens to be removed by suction.   An IOL may be implanted.   The normal lens of the eye is covered by a clear capsule. Part of that capsule is intentionally left in the eye to support the IOL.   Your surgeon may or may not use stitches to close the incision.  There are other forms of cataract surgery that require a larger incision and stiches to close the eye. This approach is taken in cases where the doctor feels that the cataract cannot be easily removed using phacoemulsification. AFTER THE  PROCEDURE  When an IOL is implanted, it does not need care. It becomes a permanent part of your eye and cannot be seen or felt.   Your doctor will schedule follow-up exams to check on your progress.   Review your other medicines with your doctor to see which can be resumed after surgery.   Use eyedrops or take medicine as prescribed by your doctor.  Document Released: 09/13/2011 Document Reviewed: 09/10/2011 [facility] Patient Information 2012 [place].  .Cataract Surgery Care After Refer to this sheet in the next few weeks. These instructions provide you with information on caring for yourself after your procedure. Your caregiver may also give you more specific instructions. Your treatment has been planned according to current medical practices, but problems sometimes occur. Call your caregiver if you have any problems or questions after your procedure.  HOME CARE INSTRUCTIONS   Avoid strenuous activities as directed by your caregiver.   Ask your caregiver when you can resume driving.   Use eyedrops or other medicines to help healing and control pressure inside your eye as directed by your caregiver.   Only take over-the-counter or prescription medicines for pain, discomfort, or fever as directed by your caregiver.   Do not to touch or rub your eyes.   You may be instructed to use a protective shield during the first few days and nights after surgery. If not, wear sunglasses to protect your eyes. This is to protect the eye from pressure or from being accidentally bumped.   Keep the area around your eye clean and dry. Avoid swimming or allowing water to hit you directly in the face while showering. Keep soap and shampoo out of your eyes.   Do not bend or lift heavy objects. Bending increases pressure in the eye. You can walk, climb stairs, and do light household chores.   Do not put a contact lens into the eye that had surgery until your caregiver says it is okay to do so.    Ask your doctor when you can return to work. This will depend on the kind of work that you do. If you work in a dusty environment, you may be advised to wear protective eyewear for a period of time.   Ask your caregiver when it will be safe to engage in sexual activity.   Continue with your regular eye exams as directed by your caregiver.  What to expect:  It is normal to feel itching and mild discomfort for a few days after cataract surgery. Some fluid discharge is also common, and your eye may be sensitive to light and touch.   After 1 to 2 days, even moderate discomfort should disappear. In most cases, healing will take about 6 weeks.   If you received an intraocular lens (IOL), you may notice that colors are very bright or have a blue tinge. Also, if you have been in bright sunlight,  everything may appear reddish for a few hours. If you see these color tinges, it is because your lens is clear and no longer cloudy. Within a few months after receiving an IOL, these extra colors should go away. When you have healed, you will probably need new glasses.  SEEK MEDICAL CARE IF:   You have increased bruising around your eye.   You have discomfort not helped by medicine.  SEEK IMMEDIATE MEDICAL CARE IF:   You have a fever.   You have a worsening or sudden vision loss.   You have redness, swelling, or increasing pain in the eye.   You have a thick discharge from the eye that had surgery.  MAKE SURE YOU:  Understand these instructions.   Will watch your condition.   Will get help right away if you are not doing well or get worse.  Document Released: 04/13/2005 Document Revised: 09/13/2011 Document Reviewed: 05/18/2011 [facility] Patient Information 2012 [place].

## 2013-11-11 ENCOUNTER — Other Ambulatory Visit: Payer: Self-pay

## 2013-11-11 ENCOUNTER — Encounter (HOSPITAL_COMMUNITY)
Admission: RE | Admit: 2013-11-11 | Discharge: 2013-11-11 | Disposition: A | Discharge status: Home / Self Care | Payer: Medicare Other | Source: Ambulatory Visit | Attending: Ophthalmology | Admitting: Ophthalmology

## 2013-11-11 ENCOUNTER — Encounter (HOSPITAL_COMMUNITY): Payer: Self-pay

## 2013-11-11 DIAGNOSIS — Z01812 Encounter for preprocedural laboratory examination: Secondary | ICD-10-CM | POA: Insufficient documentation

## 2013-11-11 DIAGNOSIS — Z01810 Encounter for preprocedural cardiovascular examination: Secondary | ICD-10-CM | POA: Insufficient documentation

## 2013-11-11 HISTORY — DX: Shortness of breath: R06.02

## 2013-11-11 LAB — HEMOGLOBIN AND HEMATOCRIT, BLOOD
HCT: 39.1 % (ref 36.0–46.0)
Hemoglobin: 13.4 g/dL (ref 12.0–15.0)

## 2013-11-11 LAB — BASIC METABOLIC PANEL
BUN: 8 mg/dL (ref 6–23)
CALCIUM: 9.9 mg/dL (ref 8.4–10.5)
CO2: 26 mEq/L (ref 19–32)
CREATININE: 0.86 mg/dL (ref 0.50–1.10)
Chloride: 100 mEq/L (ref 96–112)
GFR calc Af Amer: 82 mL/min — ABNORMAL LOW (ref >90)
GFR calc non Af Amer: 71 mL/min — ABNORMAL LOW (ref >90)
GLUCOSE: 101 mg/dL — AB (ref 70–99)
Potassium: 3.4 mEq/L — ABNORMAL LOW (ref 3.7–5.3)
Sodium: 139 mEq/L (ref 137–147)

## 2013-11-13 MED ORDER — CYCLOPENTOLATE-PHENYLEPHRINE OP SOLN OPTIME - NO CHARGE
OPHTHALMIC | Status: AC
  Filled 2013-11-13: qty 2

## 2013-11-13 MED ORDER — LIDOCAINE HCL 3.5 % OP GEL
OPHTHALMIC | Status: AC
  Filled 2013-11-13: qty 1

## 2013-11-13 MED ORDER — LIDOCAINE HCL (PF) 1 % IJ SOLN
INTRAMUSCULAR | Status: AC
  Filled 2013-11-13: qty 2

## 2013-11-13 MED ORDER — PHENYLEPHRINE HCL 2.5 % OP SOLN
OPHTHALMIC | Status: AC
  Filled 2013-11-13: qty 15

## 2013-11-13 MED ORDER — NEOMYCIN-POLYMYXIN-DEXAMETH 3.5-10000-0.1 OP SUSP
OPHTHALMIC | Status: AC
  Filled 2013-11-13: qty 5

## 2013-11-13 MED ORDER — TETRACAINE HCL 0.5 % OP SOLN
OPHTHALMIC | Status: AC
  Filled 2013-11-13: qty 2

## 2013-11-16 ENCOUNTER — Encounter (HOSPITAL_COMMUNITY): Payer: Medicare Other | Admitting: Anesthesiology

## 2013-11-16 ENCOUNTER — Ambulatory Visit (HOSPITAL_COMMUNITY): Payer: Medicare Other | Admitting: Anesthesiology

## 2013-11-16 ENCOUNTER — Encounter (HOSPITAL_COMMUNITY)
Admission: RE | Disposition: A | Discharge status: Home / Self Care | Payer: Self-pay | Source: Ambulatory Visit | Attending: Ophthalmology

## 2013-11-16 ENCOUNTER — Ambulatory Visit (HOSPITAL_COMMUNITY)
Admission: RE | Admit: 2013-11-16 | Discharge: 2013-11-16 | Disposition: A | Discharge status: Home / Self Care | Payer: Medicare Other | Source: Ambulatory Visit | Attending: Ophthalmology | Admitting: Ophthalmology

## 2013-11-16 ENCOUNTER — Encounter (HOSPITAL_COMMUNITY): Payer: Self-pay | Admitting: *Deleted

## 2013-11-16 DIAGNOSIS — F17200 Nicotine dependence, unspecified, uncomplicated: Secondary | ICD-10-CM | POA: Insufficient documentation

## 2013-11-16 DIAGNOSIS — I10 Essential (primary) hypertension: Secondary | ICD-10-CM | POA: Insufficient documentation

## 2013-11-16 DIAGNOSIS — I2510 Atherosclerotic heart disease of native coronary artery without angina pectoris: Secondary | ICD-10-CM | POA: Insufficient documentation

## 2013-11-16 DIAGNOSIS — Z79899 Other long term (current) drug therapy: Secondary | ICD-10-CM | POA: Insufficient documentation

## 2013-11-16 DIAGNOSIS — H2589 Other age-related cataract: Secondary | ICD-10-CM | POA: Insufficient documentation

## 2013-11-16 HISTORY — PX: CATARACT EXTRACTION W/PHACO: SHX586

## 2013-11-16 SURGERY — PHACOEMULSIFICATION, CATARACT, WITH IOL INSERTION
Anesthesia: Monitor Anesthesia Care | Site: Eye | Laterality: Left

## 2013-11-16 MED ORDER — LACTATED RINGERS IV SOLN
INTRAVENOUS | Status: DC
  Administered 2013-11-16: 09:00:00 via INTRAVENOUS

## 2013-11-16 MED ORDER — LIDOCAINE HCL (PF) 1 % IJ SOLN
INTRAMUSCULAR | Status: DC | PRN
  Administered 2013-11-16: .6 mL

## 2013-11-16 MED ORDER — LIDOCAINE 3.5 % OP GEL OPTIME - NO CHARGE
OPHTHALMIC | Status: DC | PRN
  Administered 2013-11-16: 2 [drp] via OPHTHALMIC

## 2013-11-16 MED ORDER — EPINEPHRINE HCL 1 MG/ML IJ SOLN
INTRAOCULAR | Status: DC | PRN
  Administered 2013-11-16: 09:00:00

## 2013-11-16 MED ORDER — NEOMYCIN-POLYMYXIN-DEXAMETH 3.5-10000-0.1 OP SUSP
OPHTHALMIC | Status: DC | PRN
  Administered 2013-11-16: 2 [drp] via OPHTHALMIC

## 2013-11-16 MED ORDER — LACTATED RINGERS IV SOLN
INTRAVENOUS | Status: DC | PRN
  Administered 2013-11-16: 08:00:00 via INTRAVENOUS

## 2013-11-16 MED ORDER — MIDAZOLAM HCL 2 MG/2ML IJ SOLN
1.0000 mg | INTRAMUSCULAR | Status: DC | PRN
  Administered 2013-11-16: 2 mg via INTRAVENOUS

## 2013-11-16 MED ORDER — PHENYLEPHRINE HCL 2.5 % OP SOLN
1.0000 [drp] | OPHTHALMIC | Status: AC
  Administered 2013-11-16 (×3): 1 [drp] via OPHTHALMIC

## 2013-11-16 MED ORDER — LIDOCAINE HCL 3.5 % OP GEL
1.0000 | Freq: Once | OPHTHALMIC | Status: AC
Start: 2013-11-16 — End: 2013-11-16
  Administered 2013-11-16: 1 via OPHTHALMIC

## 2013-11-16 MED ORDER — FENTANYL CITRATE 0.05 MG/ML IJ SOLN
INTRAMUSCULAR | Status: AC
  Filled 2013-11-16: qty 2

## 2013-11-16 MED ORDER — MIDAZOLAM HCL 2 MG/2ML IJ SOLN
INTRAMUSCULAR | Status: AC
  Filled 2013-11-16: qty 2

## 2013-11-16 MED ORDER — PROVISC 10 MG/ML IO SOLN
INTRAOCULAR | Status: DC | PRN
  Administered 2013-11-16: 0.85 mL via INTRAOCULAR

## 2013-11-16 MED ORDER — EPINEPHRINE HCL 1 MG/ML IJ SOLN
INTRAMUSCULAR | Status: AC
  Filled 2013-11-16: qty 1

## 2013-11-16 MED ORDER — POVIDONE-IODINE 5 % OP SOLN
OPHTHALMIC | Status: DC | PRN
  Administered 2013-11-16: 1 via OPHTHALMIC

## 2013-11-16 MED ORDER — TETRACAINE HCL 0.5 % OP SOLN
1.0000 [drp] | OPHTHALMIC | Status: AC
  Administered 2013-11-16 (×3): 1 [drp] via OPHTHALMIC

## 2013-11-16 MED ORDER — CYCLOPENTOLATE-PHENYLEPHRINE 0.2-1 % OP SOLN
1.0000 [drp] | OPHTHALMIC | Status: AC
  Administered 2013-11-16 (×3): 1 [drp] via OPHTHALMIC

## 2013-11-16 MED ORDER — FENTANYL CITRATE 0.05 MG/ML IJ SOLN
25.0000 ug | INTRAMUSCULAR | Status: AC
  Administered 2013-11-16 (×2): 25 ug via INTRAVENOUS

## 2013-11-16 MED ORDER — BSS IO SOLN
INTRAOCULAR | Status: DC | PRN
  Administered 2013-11-16: 15 mL via INTRAOCULAR

## 2013-11-16 SURGICAL SUPPLY — 33 items
CAPSULAR TENSION RING-AMO (OPHTHALMIC RELATED) IMPLANT
CLOTH BEACON ORANGE TIMEOUT ST (SAFETY) ×3 IMPLANT
EYE SHIELD UNIVERSAL CLEAR (GAUZE/BANDAGES/DRESSINGS) ×3 IMPLANT
GLOVE BIO SURGEON STRL SZ 6.5 (GLOVE) IMPLANT
GLOVE BIO SURGEONS STRL SZ 6.5 (GLOVE)
GLOVE BIOGEL PI IND STRL 6.5 (GLOVE) ×1 IMPLANT
GLOVE BIOGEL PI IND STRL 7.0 (GLOVE) ×1 IMPLANT
GLOVE BIOGEL PI IND STRL 7.5 (GLOVE) IMPLANT
GLOVE BIOGEL PI INDICATOR 6.5 (GLOVE) ×2
GLOVE BIOGEL PI INDICATOR 7.0 (GLOVE) ×2
GLOVE BIOGEL PI INDICATOR 7.5 (GLOVE)
GLOVE ECLIPSE 6.5 STRL STRAW (GLOVE) IMPLANT
GLOVE ECLIPSE 7.0 STRL STRAW (GLOVE) IMPLANT
GLOVE ECLIPSE 7.5 STRL STRAW (GLOVE) IMPLANT
GLOVE EXAM NITRILE LRG STRL (GLOVE) IMPLANT
GLOVE EXAM NITRILE MD LF STRL (GLOVE) IMPLANT
GLOVE SKINSENSE NS SZ6.5 (GLOVE)
GLOVE SKINSENSE NS SZ7.0 (GLOVE)
GLOVE SKINSENSE STRL SZ6.5 (GLOVE) IMPLANT
GLOVE SKINSENSE STRL SZ7.0 (GLOVE) IMPLANT
KIT VITRECTOMY (OPHTHALMIC RELATED) IMPLANT
PAD ARMBOARD 7.5X6 YLW CONV (MISCELLANEOUS) ×3 IMPLANT
PROC W NO LENS (INTRAOCULAR LENS)
PROC W SPEC LENS (INTRAOCULAR LENS)
PROCESS W NO LENS (INTRAOCULAR LENS) IMPLANT
PROCESS W SPEC LENS (INTRAOCULAR LENS) IMPLANT
RING MALYGIN (MISCELLANEOUS) IMPLANT
SIGHTPATH CAT PROC W REG LENS (Ophthalmic Related) ×3 IMPLANT
SYR TB 1ML LL NO SAFETY (SYRINGE) ×3 IMPLANT
TAPE SURG TRANSPORE 1 IN (GAUZE/BANDAGES/DRESSINGS) ×1 IMPLANT
TAPE SURGICAL TRANSPORE 1 IN (GAUZE/BANDAGES/DRESSINGS) ×2
VISCOELASTIC ADDITIONAL (OPHTHALMIC RELATED) IMPLANT
WATER STERILE IRR 250ML POUR (IV SOLUTION) ×3 IMPLANT

## 2013-11-16 NOTE — Op Note (Signed)
Date of Admission: 11/16/2013  Date of Surgery: 11/16/2013  Pre-Op Dx: Cataract  Left  Eye  Post-Op Dx: Combined Cataract  Left  Eye,  Dx Code 366.19  Surgeon: Gemma PayorKerry Shanika Levings, M.D.  Assistants: None  Anesthesia: Topical with MAC  Indications: Painless, progressive loss of vision with compromise of daily activities.  Surgery: Cataract Extraction with Intraocular lens Implant Left Eye  Discription: The patient had dilating drops and viscous lidocaine placed into the left eye in the pre-op holding area. After transfer to the operating room, a time out was performed. The patient was then prepped and draped. Beginning with a 75 degree blade a paracentesis port was made at the surgeon's 2 o'clock position. The anterior chamber was then filled with 1% non-preserved lidocaine. This was followed by filling the anterior chamber with Provisc. A 2.374mm keratome blade was used to make a clear corneal incision at the temporal limbus. A bent cystatome needle was used to create a continuous tear capsulotomy. Hydrodissection was performed with balanced salt solution on a Fine canula. The lens nucleus was then removed using the phacoemulsification handpiece. Residual cortex was removed with the I&A handpiece. The anterior chamber and capsular bag were refilled with Provisc. A posterior chamber intraocular lens was placed into the capsular bag with it's injector. The implant was positioned with the Kuglan hook. The Provisc was then removed from the anterior chamber and capsular bag with the I&A handpiece. Stromal hydration of the main incision and paracentesis port was performed with BSS on a Fine canula. The wounds were tested for leak which was negative. The patient tolerated the procedure well. There were no operative complications. The patient was then transferred to the recovery room in stable condition.  Complications: None  Specimen: None  EBL: None  Prosthetic device: B&L enVista, MX60, power 22.0D, SN  [id number].

## 2013-11-16 NOTE — Discharge Instructions (Signed)
General Anesthesia, Adult °General anesthesia is a sleep-like state of non-feeling produced by medicines (anesthetics). General anesthesia prevents you from being alert and feeling pain during a medical procedure. Your caregiver may recommend general anesthesia if your procedure: °· Is long. °· Is painful or uncomfortable. °· Would be frightening to see or hear. °· Requires you to be still. °· Affects your breathing. °· Causes significant blood loss. °LET YOUR CAREGIVER KNOW ABOUT: °· Allergies to food or medicine. °· Medicines taken, including vitamins, herbs, eyedrops, over-the-counter medicines, and creams. °· Use of steroids (by mouth or creams). °· Previous problems with anesthetics or numbing medicines, including problems experienced by relatives. °· History of bleeding problems or blood clots. °· Previous surgeries and types of anesthetics received. °· Possibility of pregnancy, if this applies. °· Use of cigarettes, alcohol, or illegal drugs. °· Any health condition(s), especially diabetes, sleep apnea, and high blood pressure. °RISKS AND COMPLICATIONS °General anesthesia rarely causes complications. However, if complications do occur, they can be life-threatening. Complications include: °· A lung infection. °· A stroke. °· A heart attack. °· Waking up during the procedure. When this occurs, the patient may be unable to move and communicate that he or she is awake. The patient may feel severe pain. °Older adults and adults with serious medical problems are more likely to have complications than adults who are young and healthy. Some complications can be prevented by answering all of your caregiver's questions thoroughly and by following all pre-procedure instructions. It is important to tell your caregiver if any of the pre-procedure instructions, especially those related to diet, were not followed. Any food or liquid in the stomach can cause problems when you are under general anesthesia. °BEFORE THE  PROCEDURE °· Ask your caregiver if you will have to spend the night at the hospital. If you will not have to spend the night, arrange to have an adult drive you and stay with you for 24-hours. °· Follow your caregiver's instructions if you are taking dietary supplements or medicines. Your caregiver may tell you to stop taking them or to reduce your dosage. °· Do not smoke for as long as possible before your procedure. If possible, stop smoking 3 6 weeks before the procedure. °· Do not take new dietary supplements or medicines within 1 week of your procedure unless your caregiver approves them. °· Do not eat within 8 hours of your procedure or as directed by your caregiver. Drink only clear liquids, such as water, black coffee (without milk or cream), and fruit juices (without pulp). °· Do not drink within 3 hours of your procedure or as directed by your caregiver. °· You may brush your teeth on the morning of the procedure, but make sure to spit out the toothpaste and water when finished. °PROCEDURE  °You will receive anesthetics through a mask, through an intravenous (IV) access tube, or through both. A doctor who specializes in anesthesia (anesthesiologist) or a nurse who specializes in anesthesia (nurse anesthetist) or both will stay with you throughout the procedure to make sure you remain unconscious. He or she will also watch your blood pressure, pulse, and oxygen levels to make sure that the anesthetics do not cause any problems. Once you are asleep, a breathing tube or mask may be used to help you breathe. °AFTER THE PROCEDURE °You will wake up after the procedure is complete. You may be in the room where the procedure was performed or in a recovery area. You may have a sore throat if   a breathing tube was used. You may also feel: °· Dizzy. °· Weak. °· Drowsy. °· Confused. °· Nauseous. °· Cold. °These are all normal responses and can be expected to last for up to 24 hours after the procedure is complete. A  caregiver will tell you when you are ready to go home. This will usually be when you are fully awake and in stable condition. °Document Released: 01/01/2008 Document Revised: 09/10/2012 Document Reviewed: 01/23/2012 °[employer]® Patient Information ©2014 [employer]. ° °

## 2013-11-16 NOTE — H&P (Signed)
I have reviewed the H&P, the patient was re-examined, and I have identified no interval changes in medical condition and plan of care since the history and physical of record  

## 2013-11-16 NOTE — Transfer of Care (Signed)
Immediate Anesthesia Transfer of Care Note  Patient: Susan Fowler  Procedure(s) Performed: Procedure(s) with comments: CATARACT EXTRACTION PHACO AND INTRAOCULAR LENS PLACEMENT (IOC) (Left) - CDE:  8.65  Patient Location: Short Stay  Anesthesia Type:MAC  Level of Consciousness: awake, alert , oriented and patient cooperative  Airway & Oxygen Therapy: Patient Spontanous Breathing  Post-op Assessment: Report given to PACU RN, Post -op Vital signs reviewed and stable and Patient moving all extremities  Post vital signs: Reviewed and stable  Complications: No apparent anesthesia complications

## 2013-11-16 NOTE — Anesthesia Preprocedure Evaluation (Signed)
Anesthesia Evaluation  Patient identified by MRN, date of birth, ID band Patient awake    Reviewed: Allergy & Precautions, H&P , NPO status , Patient's Chart, lab work & pertinent test results, reviewed documented beta blocker date and time   History of Anesthesia Complications (+) PONV  Airway Mallampati: II TM Distance: >3 FB     Dental  (+) Edentulous Upper   Pulmonary shortness of breath and with exertion, Current Smoker,  breath sounds clear to auscultation        Cardiovascular hypertension, Pt. on medications and Pt. on home beta blockers + CAD Rhythm:Regular     Neuro/Psych    GI/Hepatic PUD,   Endo/Other  Hyperthyroidism   Renal/GU Renal disease     Musculoskeletal   Abdominal   Peds  Hematology   Anesthesia Other Findings   Reproductive/Obstetrics                           Anesthesia Physical Anesthesia Plan  ASA: III  Anesthesia Plan: MAC   Post-op Pain Management:    Induction: Intravenous  Airway Management Planned: Nasal Cannula  Additional Equipment:   Intra-op Plan:   Post-operative Plan:   Informed Consent: I have reviewed the patients History and Physical, chart, labs and discussed the procedure including the risks, benefits and alternatives for the proposed anesthesia with the patient or authorized representative who has indicated his/her understanding and acceptance.     Plan Discussed with:   Anesthesia Plan Comments:         Anesthesia Quick Evaluation  

## 2013-11-16 NOTE — Anesthesia Postprocedure Evaluation (Signed)
  Anesthesia Post-op Note  Patient: Susan Fowler  Procedure(s) Performed: Procedure(s) with comments: CATARACT EXTRACTION PHACO AND INTRAOCULAR LENS PLACEMENT (IOC) (Left) - CDE:  8.65  Patient Location: Short Stay  Anesthesia Type:MAC  Level of Consciousness: awake, alert , oriented and patient cooperative  Airway and Oxygen Therapy: Patient Spontanous Breathing  Post-op Pain: none  Post-op Assessment: Post-op Vital signs reviewed, Patient's Cardiovascular Status Stable, Respiratory Function Stable, Patent Airway, No signs of Nausea or vomiting and Pain level controlled  Post-op Vital Signs: Reviewed and stable  Complications: No apparent anesthesia complications

## 2013-11-17 ENCOUNTER — Encounter (HOSPITAL_COMMUNITY): Payer: Self-pay | Admitting: Ophthalmology

## 2013-11-23 ENCOUNTER — Encounter (HOSPITAL_COMMUNITY): Payer: Self-pay | Admitting: Pharmacy Technician

## 2013-11-24 ENCOUNTER — Encounter (HOSPITAL_COMMUNITY)
Admission: RE | Admit: 2013-11-24 | Discharge: 2013-11-24 | Disposition: A | Discharge status: Home / Self Care | Payer: Medicare Other | Source: Ambulatory Visit | Attending: Ophthalmology | Admitting: Ophthalmology

## 2013-11-25 ENCOUNTER — Encounter (HOSPITAL_COMMUNITY): Payer: Self-pay

## 2013-11-25 MED ORDER — FENTANYL CITRATE 0.05 MG/ML IJ SOLN: 25.0000 ug | INTRAMUSCULAR | Status: DC | PRN

## 2013-11-25 MED ORDER — ONDANSETRON HCL 4 MG/2ML IJ SOLN: 4.0000 mg | Freq: Once | INTRAMUSCULAR | Status: DC | PRN

## 2013-11-27 MED ORDER — LIDOCAINE HCL (PF) 1 % IJ SOLN
INTRAMUSCULAR | Status: AC
  Filled 2013-11-27: qty 2

## 2013-11-27 MED ORDER — CYCLOPENTOLATE-PHENYLEPHRINE OP SOLN OPTIME - NO CHARGE
OPHTHALMIC | Status: AC
  Filled 2013-11-27: qty 2

## 2013-11-27 MED ORDER — TETRACAINE HCL 0.5 % OP SOLN
OPHTHALMIC | Status: AC
  Filled 2013-11-27: qty 2

## 2013-11-27 MED ORDER — LIDOCAINE HCL 3.5 % OP GEL
OPHTHALMIC | Status: AC
  Filled 2013-11-27: qty 1

## 2013-11-27 MED ORDER — NEOMYCIN-POLYMYXIN-DEXAMETH 3.5-10000-0.1 OP SUSP
OPHTHALMIC | Status: AC
  Filled 2013-11-27: qty 5

## 2013-11-27 MED ORDER — PHENYLEPHRINE HCL 2.5 % OP SOLN
OPHTHALMIC | Status: AC
  Filled 2013-11-27: qty 15

## 2013-11-30 ENCOUNTER — Encounter (HOSPITAL_COMMUNITY)
Admission: RE | Disposition: A | Discharge status: Home Health | Payer: Self-pay | Source: Ambulatory Visit | Attending: Ophthalmology

## 2013-11-30 ENCOUNTER — Encounter (HOSPITAL_COMMUNITY): Payer: Self-pay | Admitting: *Deleted

## 2013-11-30 ENCOUNTER — Ambulatory Visit (HOSPITAL_COMMUNITY): Payer: Medicare Other | Admitting: Anesthesiology

## 2013-11-30 ENCOUNTER — Ambulatory Visit (HOSPITAL_COMMUNITY)
Admission: RE | Admit: 2013-11-30 | Discharge: 2013-11-30 | Disposition: A | Discharge status: Home Health | Payer: Medicare Other | Source: Ambulatory Visit | Attending: Ophthalmology | Admitting: Ophthalmology

## 2013-11-30 ENCOUNTER — Encounter (HOSPITAL_COMMUNITY): Payer: Medicare Other | Admitting: Anesthesiology

## 2013-11-30 DIAGNOSIS — F17200 Nicotine dependence, unspecified, uncomplicated: Secondary | ICD-10-CM | POA: Insufficient documentation

## 2013-11-30 DIAGNOSIS — H2589 Other age-related cataract: Secondary | ICD-10-CM | POA: Insufficient documentation

## 2013-11-30 DIAGNOSIS — Z79899 Other long term (current) drug therapy: Secondary | ICD-10-CM | POA: Insufficient documentation

## 2013-11-30 DIAGNOSIS — I10 Essential (primary) hypertension: Secondary | ICD-10-CM | POA: Insufficient documentation

## 2013-11-30 DIAGNOSIS — Z7982 Long term (current) use of aspirin: Secondary | ICD-10-CM | POA: Insufficient documentation

## 2013-11-30 HISTORY — PX: CATARACT EXTRACTION W/PHACO: SHX586

## 2013-11-30 SURGERY — PHACOEMULSIFICATION, CATARACT, WITH IOL INSERTION
Anesthesia: Monitor Anesthesia Care | Site: Eye | Laterality: Right

## 2013-11-30 MED ORDER — LIDOCAINE HCL 3.5 % OP GEL
1.0000 "application " | Freq: Once | OPHTHALMIC | Status: AC
  Administered 2013-11-30: 1 via OPHTHALMIC

## 2013-11-30 MED ORDER — CYCLOPENTOLATE-PHENYLEPHRINE 0.2-1 % OP SOLN
1.0000 [drp] | OPHTHALMIC | Status: AC
  Administered 2013-11-30 (×3): 1 [drp] via OPHTHALMIC

## 2013-11-30 MED ORDER — FENTANYL CITRATE 0.05 MG/ML IJ SOLN
INTRAMUSCULAR | Status: AC
  Filled 2013-11-30: qty 2

## 2013-11-30 MED ORDER — NEOMYCIN-POLYMYXIN-DEXAMETH 3.5-10000-0.1 OP SUSP
OPHTHALMIC | Status: DC | PRN
  Administered 2013-11-30: 1 [drp] via OPHTHALMIC

## 2013-11-30 MED ORDER — EPINEPHRINE HCL 1 MG/ML IJ SOLN
INTRAOCULAR | Status: DC | PRN
  Administered 2013-11-30: 09:00:00

## 2013-11-30 MED ORDER — MIDAZOLAM HCL 2 MG/2ML IJ SOLN
INTRAMUSCULAR | Status: AC
  Filled 2013-11-30: qty 2

## 2013-11-30 MED ORDER — FENTANYL CITRATE 0.05 MG/ML IJ SOLN
25.0000 ug | INTRAMUSCULAR | Status: AC
  Administered 2013-11-30 (×2): 25 ug via INTRAVENOUS

## 2013-11-30 MED ORDER — LIDOCAINE 3.5 % OP GEL OPTIME - NO CHARGE
OPHTHALMIC | Status: DC | PRN
  Administered 2013-11-30: 1 [drp] via OPHTHALMIC

## 2013-11-30 MED ORDER — TETRACAINE HCL 0.5 % OP SOLN
1.0000 [drp] | OPHTHALMIC | Status: AC
  Administered 2013-11-30 (×3): 1 [drp] via OPHTHALMIC

## 2013-11-30 MED ORDER — PROVISC 10 MG/ML IO SOLN
INTRAOCULAR | Status: DC | PRN
  Administered 2013-11-30: 0.85 mL via INTRAOCULAR

## 2013-11-30 MED ORDER — BSS IO SOLN
INTRAOCULAR | Status: DC | PRN
  Administered 2013-11-30: 15 mL via INTRAOCULAR

## 2013-11-30 MED ORDER — POVIDONE-IODINE 5 % OP SOLN
OPHTHALMIC | Status: DC | PRN
  Administered 2013-11-30: 1 via OPHTHALMIC

## 2013-11-30 MED ORDER — EPINEPHRINE HCL 1 MG/ML IJ SOLN
INTRAMUSCULAR | Status: AC
  Filled 2013-11-30: qty 1

## 2013-11-30 MED ORDER — MIDAZOLAM HCL 2 MG/2ML IJ SOLN
1.0000 mg | INTRAMUSCULAR | Status: DC | PRN
  Administered 2013-11-30: 2 mg via INTRAVENOUS

## 2013-11-30 MED ORDER — LACTATED RINGERS IV SOLN
INTRAVENOUS | Status: DC
  Administered 2013-11-30: 1000 mL via INTRAVENOUS

## 2013-11-30 MED ORDER — PHENYLEPHRINE HCL 2.5 % OP SOLN
1.0000 [drp] | OPHTHALMIC | Status: AC
  Administered 2013-11-30 (×3): 1 [drp] via OPHTHALMIC

## 2013-11-30 MED ORDER — LIDOCAINE HCL (PF) 1 % IJ SOLN
INTRAMUSCULAR | Status: DC | PRN
  Administered 2013-11-30: .5 mL

## 2013-11-30 SURGICAL SUPPLY — 32 items

## 2013-11-30 NOTE — Anesthesia Preprocedure Evaluation (Signed)
Anesthesia Evaluation  Patient identified by MRN, date of birth, ID band Patient awake    Reviewed: Allergy & Precautions, H&P , NPO status , Patient's Chart, lab work & pertinent test results, reviewed documented beta blocker date and time   History of Anesthesia Complications (+) PONV  Airway Mallampati: II TM Distance: >3 FB     Dental  (+) Edentulous Upper   Pulmonary shortness of breath and with exertion, Current Smoker,  breath sounds clear to auscultation        Cardiovascular hypertension, Pt. on medications and Pt. on home beta blockers + CAD Rhythm:Regular     Neuro/Psych    GI/Hepatic PUD,   Endo/Other  Hyperthyroidism   Renal/GU Renal disease     Musculoskeletal   Abdominal   Peds  Hematology   Anesthesia Other Findings   Reproductive/Obstetrics                           Anesthesia Physical Anesthesia Plan  ASA: III  Anesthesia Plan: MAC   Post-op Pain Management:    Induction: Intravenous  Airway Management Planned: Nasal Cannula  Additional Equipment:   Intra-op Plan:   Post-operative Plan:   Informed Consent: I have reviewed the patients History and Physical, chart, labs and discussed the procedure including the risks, benefits and alternatives for the proposed anesthesia with the patient or authorized representative who has indicated his/her understanding and acceptance.     Plan Discussed with:   Anesthesia Plan Comments:         Anesthesia Quick Evaluation

## 2013-11-30 NOTE — Anesthesia Postprocedure Evaluation (Signed)
  Anesthesia Post-op Note  Patient: Susan ChafeChristine F Cruise  Procedure(s) Performed: Procedure(s) with comments: CATARACT EXTRACTION PHACO AND INTRAOCULAR LENS PLACEMENT (IOC) (Right) - CDE:7.50  Patient Location: Short Stay  Anesthesia Type:MAC  Level of Consciousness: awake  Airway and Oxygen Therapy: Patient Spontanous Breathing  Post-op Pain: none  Post-op Assessment: Post-op Vital signs reviewed, Patient's Cardiovascular Status Stable, Respiratory Function Stable, Patent Airway and No signs of Nausea or vomiting  Post-op Vital Signs: Reviewed  Complications: No apparent anesthesia complications

## 2013-11-30 NOTE — Op Note (Signed)
Date of Admission: 11/30/2013  Date of Surgery: 11/30/2013  Pre-Op Dx: Cataract  Right  Eye  Post-Op Dx: Combined Cataract  Right  Eye,  Dx Code 366.19  Surgeon: Gemma PayorKerry Cybele Maule, M.D.  Assistants: None  Anesthesia: Topical with MAC  Indications: Painless, progressive loss of vision with compromise of daily activities.  Surgery: Cataract Extraction with Intraocular lens Implant Right Eye  Discription: The patient had dilating drops and viscous lidocaine placed into the right eye in the pre-op holding area. After transfer to the operating room, a time out was performed. The patient was then prepped and draped. Beginning with a 75 degree blade a paracentesis port was made at the surgeon's 2 o'clock position. The anterior chamber was then filled with 1% non-preserved lidocaine. This was followed by filling the anterior chamber with Provisc.  A 2.244mm keratome blade was used to make a clear corneal incision at the temporal limbus.  A bent cystatome needle was used to create a continuous tear capsulotomy. Hydrodissection was performed with balanced salt solution on a Fine canula. The lens nucleus was then removed using the phacoemulsification handpiece. Residual cortex was removed with the I&A handpiece. The anterior chamber and capsular bag were refilled with Provisc. A posterior chamber intraocular lens was placed into the capsular bag with it's injector. The implant was positioned with the Kuglan hook. The Provisc was then removed from the anterior chamber and capsular bag with the I&A handpiece. Stromal hydration of the main incision and paracentesis port was performed with BSS on a Fine canula. The wounds were tested for leak which was negative. The patient tolerated the procedure well. There were no operative complications. The patient was then transferred to the recovery room in stable condition.  Complications: None  Specimen: None  EBL: None  Prosthetic device: B&L enVista, MX60, power 22.5D, SN  [id number].

## 2013-11-30 NOTE — H&P (Signed)
I have reviewed the H&P, the patient was re-examined, and I have identified no interval changes in medical condition and plan of care since the history and physical of record  

## 2013-11-30 NOTE — Discharge Instructions (Signed)

## 2013-11-30 NOTE — Transfer of Care (Signed)
Immediate Anesthesia Transfer of Care Note  Patient: Susan Fowler  Procedure(s) Performed: Procedure(s) with comments: CATARACT EXTRACTION PHACO AND INTRAOCULAR LENS PLACEMENT (IOC) (Right) - CDE:7.50  Patient Location: Short Stay  Anesthesia Type:MAC  Level of Consciousness: awake  Airway & Oxygen Therapy: Patient Spontanous Breathing  Post-op Assessment: Report given to PACU RN  Post vital signs: Reviewed  Complications: No apparent anesthesia complications

## 2013-12-01 ENCOUNTER — Encounter (HOSPITAL_COMMUNITY): Payer: Self-pay | Admitting: Ophthalmology

## 2014-12-15 ENCOUNTER — Ambulatory Visit (INDEPENDENT_AMBULATORY_CARE_PROVIDER_SITE_OTHER): Payer: Medicare Other | Admitting: Psychiatry

## 2014-12-15 ENCOUNTER — Encounter (HOSPITAL_COMMUNITY): Payer: Self-pay | Admitting: Psychiatry

## 2014-12-15 VITALS — BP 102/73 | HR 84 | Ht 65.0 in | Wt 191.6 lb

## 2014-12-15 DIAGNOSIS — F411 Generalized anxiety disorder: Secondary | ICD-10-CM

## 2014-12-15 MED ORDER — ALPRAZOLAM 0.5 MG PO TABS: 0.5000 mg | ORAL_TABLET | Freq: Three times a day (TID) | ORAL | Status: DC
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
## 2014-12-21 ENCOUNTER — Telehealth (HOSPITAL_COMMUNITY): Payer: Self-pay | Admitting: *Deleted

## 2014-12-21 NOTE — Telephone Encounter (Signed)
noted 

## 2014-12-21 NOTE — Telephone Encounter (Signed)
Susan Fowler called, said Dr. Marilu FavreQuershi changed patient's medications and said she don't need to come back for appointments here.   Caller said to cancel patients appointment.

## 2015-01-12 ENCOUNTER — Ambulatory Visit (HOSPITAL_COMMUNITY): Payer: Self-pay | Admitting: Psychiatry

## 2015-10-25 DIAGNOSIS — M79674 Pain in right toe(s): Secondary | ICD-10-CM | POA: Diagnosis not present

## 2015-10-25 DIAGNOSIS — B351 Tinea unguium: Secondary | ICD-10-CM | POA: Diagnosis not present

## 2015-10-25 DIAGNOSIS — M79675 Pain in left toe(s): Secondary | ICD-10-CM | POA: Diagnosis not present

## 2016-02-08 DIAGNOSIS — M79674 Pain in right toe(s): Secondary | ICD-10-CM | POA: Diagnosis not present

## 2016-02-08 DIAGNOSIS — M79675 Pain in left toe(s): Secondary | ICD-10-CM | POA: Diagnosis not present

## 2016-02-08 DIAGNOSIS — B351 Tinea unguium: Secondary | ICD-10-CM | POA: Diagnosis not present

## 2016-04-26 DIAGNOSIS — R531 Weakness: Secondary | ICD-10-CM | POA: Diagnosis not present

## 2016-04-26 DIAGNOSIS — E559 Vitamin D deficiency, unspecified: Secondary | ICD-10-CM | POA: Diagnosis not present

## 2016-04-26 DIAGNOSIS — I10 Essential (primary) hypertension: Secondary | ICD-10-CM | POA: Diagnosis not present

## 2016-04-26 DIAGNOSIS — R5381 Other malaise: Secondary | ICD-10-CM | POA: Diagnosis not present

## 2016-04-26 DIAGNOSIS — E569 Vitamin deficiency, unspecified: Secondary | ICD-10-CM | POA: Diagnosis not present

## 2016-04-26 DIAGNOSIS — Z79899 Other long term (current) drug therapy: Secondary | ICD-10-CM | POA: Diagnosis not present

## 2016-05-02 DIAGNOSIS — M79675 Pain in left toe(s): Secondary | ICD-10-CM | POA: Diagnosis not present

## 2016-05-02 DIAGNOSIS — B351 Tinea unguium: Secondary | ICD-10-CM | POA: Diagnosis not present

## 2016-05-02 DIAGNOSIS — M79674 Pain in right toe(s): Secondary | ICD-10-CM | POA: Diagnosis not present

## 2016-05-28 DIAGNOSIS — R5381 Other malaise: Secondary | ICD-10-CM | POA: Diagnosis not present

## 2016-05-28 DIAGNOSIS — E559 Vitamin D deficiency, unspecified: Secondary | ICD-10-CM | POA: Diagnosis not present

## 2016-05-28 DIAGNOSIS — I10 Essential (primary) hypertension: Secondary | ICD-10-CM | POA: Diagnosis not present

## 2016-05-28 DIAGNOSIS — E569 Vitamin deficiency, unspecified: Secondary | ICD-10-CM | POA: Diagnosis not present

## 2016-05-28 DIAGNOSIS — Z79899 Other long term (current) drug therapy: Secondary | ICD-10-CM | POA: Diagnosis not present

## 2016-05-28 DIAGNOSIS — R531 Weakness: Secondary | ICD-10-CM | POA: Diagnosis not present

## 2016-07-31 DIAGNOSIS — Z23 Encounter for immunization: Secondary | ICD-10-CM | POA: Diagnosis not present

## 2016-08-07 DIAGNOSIS — R531 Weakness: Secondary | ICD-10-CM | POA: Diagnosis not present

## 2016-08-07 DIAGNOSIS — E782 Mixed hyperlipidemia: Secondary | ICD-10-CM | POA: Diagnosis not present

## 2016-08-07 DIAGNOSIS — E119 Type 2 diabetes mellitus without complications: Secondary | ICD-10-CM | POA: Diagnosis not present

## 2016-08-07 DIAGNOSIS — E7800 Pure hypercholesterolemia, unspecified: Secondary | ICD-10-CM | POA: Diagnosis not present

## 2016-08-07 DIAGNOSIS — R5381 Other malaise: Secondary | ICD-10-CM | POA: Diagnosis not present

## 2016-08-07 DIAGNOSIS — E569 Vitamin deficiency, unspecified: Secondary | ICD-10-CM | POA: Diagnosis not present

## 2016-08-07 DIAGNOSIS — I10 Essential (primary) hypertension: Secondary | ICD-10-CM | POA: Diagnosis not present

## 2016-08-07 DIAGNOSIS — E559 Vitamin D deficiency, unspecified: Secondary | ICD-10-CM | POA: Diagnosis not present

## 2016-08-16 DIAGNOSIS — M79674 Pain in right toe(s): Secondary | ICD-10-CM | POA: Diagnosis not present

## 2016-08-16 DIAGNOSIS — B351 Tinea unguium: Secondary | ICD-10-CM | POA: Diagnosis not present

## 2016-08-16 DIAGNOSIS — M79675 Pain in left toe(s): Secondary | ICD-10-CM | POA: Diagnosis not present

## 2016-11-02 DIAGNOSIS — J449 Chronic obstructive pulmonary disease, unspecified: Secondary | ICD-10-CM | POA: Diagnosis not present

## 2016-11-02 DIAGNOSIS — I10 Essential (primary) hypertension: Secondary | ICD-10-CM | POA: Diagnosis not present

## 2016-11-02 DIAGNOSIS — G301 Alzheimer's disease with late onset: Secondary | ICD-10-CM | POA: Diagnosis not present

## 2016-11-02 DIAGNOSIS — G459 Transient cerebral ischemic attack, unspecified: Secondary | ICD-10-CM | POA: Diagnosis not present

## 2016-12-03 DIAGNOSIS — B351 Tinea unguium: Secondary | ICD-10-CM | POA: Diagnosis not present

## 2016-12-03 DIAGNOSIS — M79674 Pain in right toe(s): Secondary | ICD-10-CM | POA: Diagnosis not present

## 2016-12-03 DIAGNOSIS — M79675 Pain in left toe(s): Secondary | ICD-10-CM | POA: Diagnosis not present

## 2017-01-30 ENCOUNTER — Other Ambulatory Visit (HOSPITAL_COMMUNITY): Payer: Self-pay | Admitting: Internal Medicine

## 2017-01-30 DIAGNOSIS — Z780 Asymptomatic menopausal state: Secondary | ICD-10-CM

## 2017-01-30 DIAGNOSIS — I10 Essential (primary) hypertension: Secondary | ICD-10-CM | POA: Diagnosis not present

## 2017-01-30 DIAGNOSIS — E785 Hyperlipidemia, unspecified: Secondary | ICD-10-CM | POA: Diagnosis not present

## 2017-01-30 DIAGNOSIS — Z1231 Encounter for screening mammogram for malignant neoplasm of breast: Secondary | ICD-10-CM

## 2017-01-30 DIAGNOSIS — G301 Alzheimer's disease with late onset: Secondary | ICD-10-CM | POA: Diagnosis not present

## 2017-01-30 DIAGNOSIS — J449 Chronic obstructive pulmonary disease, unspecified: Secondary | ICD-10-CM | POA: Diagnosis not present

## 2017-01-30 DIAGNOSIS — Z23 Encounter for immunization: Secondary | ICD-10-CM | POA: Diagnosis not present

## 2017-02-08 DIAGNOSIS — H43393 Other vitreous opacities, bilateral: Secondary | ICD-10-CM | POA: Diagnosis not present

## 2017-02-08 DIAGNOSIS — H25043 Posterior subcapsular polar age-related cataract, bilateral: Secondary | ICD-10-CM | POA: Diagnosis not present

## 2017-02-11 DIAGNOSIS — M79674 Pain in right toe(s): Secondary | ICD-10-CM | POA: Diagnosis not present

## 2017-02-11 DIAGNOSIS — M79675 Pain in left toe(s): Secondary | ICD-10-CM | POA: Diagnosis not present

## 2017-02-11 DIAGNOSIS — B351 Tinea unguium: Secondary | ICD-10-CM | POA: Diagnosis not present

## 2017-02-14 ENCOUNTER — Ambulatory Visit (HOSPITAL_COMMUNITY)
Admission: RE | Admit: 2017-02-14 | Discharge: 2017-02-14 | Disposition: A | Discharge status: Home / Self Care | Payer: Medicare Other | Source: Ambulatory Visit | Attending: Internal Medicine | Admitting: Internal Medicine

## 2017-02-14 DIAGNOSIS — Z1231 Encounter for screening mammogram for malignant neoplasm of breast: Secondary | ICD-10-CM

## 2017-02-14 DIAGNOSIS — Z780 Asymptomatic menopausal state: Secondary | ICD-10-CM | POA: Diagnosis not present

## 2017-02-14 DIAGNOSIS — M8588 Other specified disorders of bone density and structure, other site: Secondary | ICD-10-CM | POA: Diagnosis not present

## 2017-02-14 DIAGNOSIS — M85851 Other specified disorders of bone density and structure, right thigh: Secondary | ICD-10-CM | POA: Diagnosis not present

## 2017-05-06 DIAGNOSIS — E785 Hyperlipidemia, unspecified: Secondary | ICD-10-CM | POA: Diagnosis not present

## 2017-05-06 DIAGNOSIS — I10 Essential (primary) hypertension: Secondary | ICD-10-CM | POA: Diagnosis not present

## 2017-05-06 DIAGNOSIS — G301 Alzheimer's disease with late onset: Secondary | ICD-10-CM | POA: Diagnosis not present

## 2017-05-06 DIAGNOSIS — J449 Chronic obstructive pulmonary disease, unspecified: Secondary | ICD-10-CM | POA: Diagnosis not present

## 2017-06-04 DIAGNOSIS — F0390 Unspecified dementia without behavioral disturbance: Secondary | ICD-10-CM | POA: Diagnosis not present

## 2017-06-04 DIAGNOSIS — N390 Urinary tract infection, site not specified: Secondary | ICD-10-CM | POA: Diagnosis not present

## 2017-06-04 DIAGNOSIS — R8290 Unspecified abnormal findings in urine: Secondary | ICD-10-CM | POA: Diagnosis not present

## 2017-07-01 DIAGNOSIS — B351 Tinea unguium: Secondary | ICD-10-CM | POA: Diagnosis not present

## 2017-07-01 DIAGNOSIS — M79675 Pain in left toe(s): Secondary | ICD-10-CM | POA: Diagnosis not present

## 2017-07-01 DIAGNOSIS — M79674 Pain in right toe(s): Secondary | ICD-10-CM | POA: Diagnosis not present

## 2017-07-12 DIAGNOSIS — Z23 Encounter for immunization: Secondary | ICD-10-CM | POA: Diagnosis not present

## 2017-08-06 DIAGNOSIS — G301 Alzheimer's disease with late onset: Secondary | ICD-10-CM | POA: Diagnosis not present

## 2017-08-06 DIAGNOSIS — I10 Essential (primary) hypertension: Secondary | ICD-10-CM | POA: Diagnosis not present

## 2017-08-06 DIAGNOSIS — E785 Hyperlipidemia, unspecified: Secondary | ICD-10-CM | POA: Diagnosis not present

## 2017-08-06 DIAGNOSIS — J449 Chronic obstructive pulmonary disease, unspecified: Secondary | ICD-10-CM | POA: Diagnosis not present

## 2017-08-22 DIAGNOSIS — H25043 Posterior subcapsular polar age-related cataract, bilateral: Secondary | ICD-10-CM | POA: Diagnosis not present

## 2017-09-09 DIAGNOSIS — M79675 Pain in left toe(s): Secondary | ICD-10-CM | POA: Diagnosis not present

## 2017-09-09 DIAGNOSIS — M79674 Pain in right toe(s): Secondary | ICD-10-CM | POA: Diagnosis not present

## 2017-09-09 DIAGNOSIS — B351 Tinea unguium: Secondary | ICD-10-CM | POA: Diagnosis not present

## 2017-09-30 ENCOUNTER — Emergency Department (HOSPITAL_COMMUNITY)
Admission: EM | Admit: 2017-09-30 | Discharge: 2017-09-30 | Disposition: A | Discharge status: Home / Self Care | Payer: Medicare Other | Attending: Emergency Medicine | Admitting: Emergency Medicine

## 2017-09-30 ENCOUNTER — Emergency Department (HOSPITAL_COMMUNITY): Payer: Medicare Other

## 2017-09-30 ENCOUNTER — Encounter (HOSPITAL_COMMUNITY): Payer: Self-pay | Admitting: *Deleted

## 2017-09-30 DIAGNOSIS — Z79899 Other long term (current) drug therapy: Secondary | ICD-10-CM | POA: Insufficient documentation

## 2017-09-30 DIAGNOSIS — F0281 Dementia in other diseases classified elsewhere with behavioral disturbance: Secondary | ICD-10-CM | POA: Insufficient documentation

## 2017-09-30 DIAGNOSIS — I2510 Atherosclerotic heart disease of native coronary artery without angina pectoris: Secondary | ICD-10-CM | POA: Diagnosis not present

## 2017-09-30 DIAGNOSIS — Z87891 Personal history of nicotine dependence: Secondary | ICD-10-CM | POA: Diagnosis not present

## 2017-09-30 DIAGNOSIS — G300 Alzheimer's disease with early onset: Secondary | ICD-10-CM | POA: Diagnosis not present

## 2017-09-30 DIAGNOSIS — E039 Hypothyroidism, unspecified: Secondary | ICD-10-CM | POA: Diagnosis not present

## 2017-09-30 DIAGNOSIS — R4182 Altered mental status, unspecified: Secondary | ICD-10-CM

## 2017-09-30 DIAGNOSIS — I10 Essential (primary) hypertension: Secondary | ICD-10-CM | POA: Diagnosis not present

## 2017-09-30 DIAGNOSIS — Z7982 Long term (current) use of aspirin: Secondary | ICD-10-CM | POA: Insufficient documentation

## 2017-09-30 DIAGNOSIS — J449 Chronic obstructive pulmonary disease, unspecified: Secondary | ICD-10-CM | POA: Insufficient documentation

## 2017-09-30 DIAGNOSIS — F02818 Dementia in other diseases classified elsewhere, unspecified severity, with other behavioral disturbance: Secondary | ICD-10-CM

## 2017-09-30 HISTORY — DX: Chronic obstructive pulmonary disease, unspecified: J44.9

## 2017-09-30 HISTORY — DX: Transient cerebral ischemic attack, unspecified: G45.9

## 2017-09-30 LAB — BASIC METABOLIC PANEL
ANION GAP: 13 (ref 5–15)
BUN: 16 mg/dL (ref 6–20)
CHLORIDE: 102 mmol/L (ref 101–111)
CO2: 22 mmol/L (ref 22–32)
Calcium: 9.9 mg/dL (ref 8.9–10.3)
Creatinine, Ser: 0.98 mg/dL (ref 0.44–1.00)
GFR calc non Af Amer: 59 mL/min — ABNORMAL LOW (ref >60)
GLUCOSE: 94 mg/dL (ref 65–99)
Potassium: 3.6 mmol/L (ref 3.5–5.1)
Sodium: 137 mmol/L (ref 135–145)

## 2017-09-30 LAB — URINALYSIS, ROUTINE W REFLEX MICROSCOPIC
BILIRUBIN URINE: NEGATIVE
Glucose, UA: NEGATIVE mg/dL
HGB URINE DIPSTICK: NEGATIVE
KETONES UR: NEGATIVE mg/dL
NITRITE: NEGATIVE
Protein, ur: NEGATIVE mg/dL
RBC / HPF: NONE SEEN RBC/hpf (ref 0–5)
Specific Gravity, Urine: 1.013 (ref 1.005–1.030)
pH: 5 (ref 5.0–8.0)

## 2017-09-30 LAB — CBC WITH DIFFERENTIAL/PLATELET
BASOS ABS: 0.1 10*3/uL (ref 0.0–0.1)
BASOS PCT: 1 %
Eosinophils Absolute: 0.1 10*3/uL (ref 0.0–0.7)
Eosinophils Relative: 1 %
HCT: 41.4 % (ref 36.0–46.0)
HEMOGLOBIN: 13.7 g/dL (ref 12.0–15.0)
LYMPHS PCT: 39 %
Lymphs Abs: 2.5 10*3/uL (ref 0.7–4.0)
MCH: 34.2 pg — ABNORMAL HIGH (ref 26.0–34.0)
MCHC: 33.1 g/dL (ref 30.0–36.0)
MCV: 103.2 fL — AB (ref 78.0–100.0)
MONO ABS: 0.6 10*3/uL (ref 0.1–1.0)
MONOS PCT: 9 %
NEUTROS PCT: 50 %
Neutro Abs: 3.3 10*3/uL (ref 1.7–7.7)
Platelets: 180 10*3/uL (ref 150–400)
RBC: 4.01 MIL/uL (ref 3.87–5.11)
RDW: 12.2 % (ref 11.5–15.5)
WBC: 6.4 10*3/uL (ref 4.0–10.5)

## 2017-09-30 NOTE — Discharge Instructions (Signed)
Chest x-ray and lab workup without any significant findings.  Suspect patient just had an episode.  Seems to be calm and cooperative here.  Stable for discharge back to [facility].  Return for any new or worse symptoms.

## 2017-09-30 NOTE — ED Notes (Signed)
Pt left with care provider and taken back to [facility].

## 2017-09-30 NOTE — ED Provider Notes (Signed)
[facility] EMERGENCY DEPARTMENT Provider Note   CSN: [id number] Arrival date & time: 09/30/17  2054     History   Chief Complaint Chief Complaint  Patient presents with  . Altered Mental Status    HPI Susan Fowler is a 66 y.o. female.  [facility] brought in from [facility].  Patient is known to have a history of dementia.  Staff noted that patient was less cooperative than usual.  Seem to be anxious.  No decrease in mental status.  No change in her medications.  Patient is normally given Xanax at bedtime she is already received that.  Patient here seems to be cooperative she is standing.  Not causing any specific problems.  Apparently no history of fever or nausea or vomiting.  No history of fall or injury recently.      Past Medical History:  Diagnosis Date  . Bright's disease    Reportedly urethral transplant, single functioning kidney  . COPD (chronic obstructive pulmonary disease) (HCC)   . Coronary atherosclerosis of native coronary artery    Nonobstructive at catheterization 2004  . Dementia   . Essential hypertension, benign   . Hyperthyroidism   . Mixed hyperlipidemia   . Peptic ulcer disease   . Shortness of breath   . TIA (transient ischemic attack)     Patient Active Problem List   Diagnosis Date Noted  . Generalized anxiety disorder 12/15/2014  . Precordial pain 06/23/2012  . Coronary atherosclerosis of native coronary artery 06/23/2012  . Tobacco abuse 06/23/2012  . Essential hypertension, benign 06/23/2012  . Mixed hyperlipidemia 06/23/2012    Past Surgical History:  Procedure Laterality Date  . ABDOMINAL HYSTERECTOMY    . CARPAL TUNNEL RELEASE Bilateral   . CATARACT EXTRACTION W/PHACO Left 11/16/2013   Procedure: CATARACT EXTRACTION PHACO AND INTRAOCULAR LENS PLACEMENT (IOC);  Surgeon: Gemma Payor, MD;  Location: [facility];  Service: Ophthalmology;  Laterality: Left;  CDE:  8.65  . CATARACT EXTRACTION W/PHACO Right 11/30/2013   Procedure: CATARACT EXTRACTION PHACO AND INTRAOCULAR LENS PLACEMENT (IOC);  Surgeon: Gemma Payor, MD;  Location: [facility];  Service: Ophthalmology;  Laterality: Right;  CDE:7.50  . HEMORRHOID SURGERY    . LUMBAR SPINE SURGERY    . Urethral transplant      OB History    No data available       Home Medications    Prior to Admission medications   Medication Sig Start Date End Date Taking? Authorizing Provider  ALPRAZolam Prudy Feeler) 0.5 MG tablet Take 1 tablet (0.5 mg total) by mouth 3 (three) times daily. Patient taking differently: Take 0.5 mg by mouth at bedtime.  12/15/14  Yes Myrlene Broker, MD  aspirin EC 81 MG tablet Take 81 mg by mouth daily.   Yes [provider]  Cholecalciferol (VITAMIN D) 2000 units CAPS Take 1 capsule by mouth daily.   Yes [provider]  citalopram (CELEXA) 20 MG tablet Take 20 mg by mouth daily. 10/20/14  Yes [provider]  Melatonin 3 MG TABS Take 3 mg by mouth at bedtime.   Yes [provider]  Memantine HCl-Donepezil HCl (NAMZARIC) 28-10 MG CP24 Take 1 capsule by mouth daily.   Yes [provider]  simvastatin (ZOCOR) 40 MG tablet Take 40 mg by mouth daily. 11/23/14  Yes [provider]  Vitamin D, Ergocalciferol, (DRISDOL) 50000 UNITS CAPS capsule Take 50,000 Units by mouth once a week. 11/15/14  Yes [provider]  vitamin E  400 UNIT capsule Take 400 Units by mouth daily.   Yes [provider]    Family History Family History  Problem Relation Age of Onset  . Alcohol abuse Brother   . Depression Son   . Alcohol abuse Son   . Drug abuse Son   . Alcohol abuse Son   . Diabetes type II Unknown   . Hypertension Unknown   . Cancer Unknown   . Coronary artery disease Mother   . Dementia Mother   . Dementia Father   . Alcohol abuse Brother     Social History Social History   Tobacco Use  . Smoking status: Former Smoker    Packs/day: 1.00    Years: 50.00    Pack years: 50.00      Types: Cigarettes  . Smokeless tobacco: Never Used  . Tobacco comment: Quit 10-08-2014 per Pt  Substance Use Topics  . Alcohol use: No  . Drug use: No     Allergies   Patient has no known allergies.   Review of Systems Review of Systems  Unable to perform ROS: Dementia     Physical Exam Updated Vital Signs BP (!) 111/98 (BP Location: Right Arm)   Pulse 79   Temp 97.6 F (36.4 C) (Temporal)   Resp 16   SpO2 98%   Physical Exam  Constitutional: She appears well-developed and well-nourished. No distress.  HENT:  Head: Normocephalic and atraumatic.  Mouth/Throat: Oropharynx is clear and moist.  Eyes: Conjunctivae and EOM are normal. Pupils are equal, round, and reactive to light.  Neck: Neck supple.  Cardiovascular: Normal rate.  Pulmonary/Chest: Effort normal and breath sounds normal. No respiratory distress.  Abdominal: Soft. Bowel sounds are normal. There is no tenderness.  Musculoskeletal: Normal range of motion.  Neurological: She is alert. No cranial nerve deficit. She exhibits normal muscle tone. Coordination normal.  Skin: Skin is warm.  Nursing note and vitals reviewed.    ED Treatments / Results  Labs (all labs ordered are listed, but only abnormal results are displayed) Labs Reviewed  CBC WITH DIFFERENTIAL/PLATELET - Abnormal; Notable for the following components:      Result Value   MCV 103.2 (*)    MCH 34.2 (*)    All other components within normal limits  BASIC METABOLIC PANEL - Abnormal; Notable for the following components:   GFR calc non Af Amer 59 (*)    All other components within normal limits  URINALYSIS, ROUTINE W REFLEX MICROSCOPIC - Abnormal; Notable for the following components:   APPearance HAZY (*)    Leukocytes, UA TRACE (*)    Bacteria, UA RARE (*)    Squamous Epithelial / LPF 6-30 (*)    All other components within normal limits    EKG  EKG Interpretation None       Radiology Dg Chest 2 View  Result Date:  09/30/2017 CLINICAL DATA:  Acute onset of confusion.  Altered mental status. EXAM: CHEST  2 VIEW COMPARISON:  Chest radiograph performed 05/31/2008 FINDINGS: The lungs are well-aerated and clear. There is no evidence of focal opacification, pleural effusion or pneumothorax. The heart is normal in size; the mediastinal contour is within normal limits. No acute osseous abnormalities are seen. Lumbar spinal fusion hardware is partially imaged. IMPRESSION: No acute cardiopulmonary process seen. Electronically Signed   By: Roanna RaiderJeffery  Chang M.D.   On: 09/30/2017 22:10    Procedures Procedures (including critical care time)  Medications Ordered in ED Medications - No data to  display   Initial Impression / Assessment and Plan / ED Course  I have reviewed the triage vital signs and the nursing notes.  Pertinent labs & imaging results that were available during my care of the patient were reviewed by me and considered in my medical decision making (see chart for details).     Patient seems to be exhibiting some behavioral disturbance with her dementia.  Which is normally her baseline.  Certainly quite alert.  No depressed mental status.  No fever here.  Doubt that there is any secondary infection but general blood work screening was done along with chest x-ray and urinalysis just to rule out any cause of infection.  If negative patient is stable for return back to [facility].  Patient also did receive her evening dose of Xanax was helping the situation.  Chest x-ray CBC and basic metabolic panel and urinalysis without any significant abnormalities.  Patient remains calm and cooperative here.  Patient stable for discharge back to [facility].  Final Rusklinical Impressions(s) / ED Diagnoses   Final diagnoses:  Altered mental status, unspecified altered mental status type  Early onset Alzheimer's disease with behavioral disturbance    ED Discharge Orders    None       Vanetta MuldersZackowski, Yadiel Aubry, MD 09/30/17  2241

## 2017-09-30 NOTE — ED Triage Notes (Signed)
Pt from [facility] and has dementia, but staff has noticed a change in pt's behavior and increased confusion.

## 2017-10-18 DIAGNOSIS — J449 Chronic obstructive pulmonary disease, unspecified: Secondary | ICD-10-CM | POA: Diagnosis not present

## 2017-10-18 DIAGNOSIS — G459 Transient cerebral ischemic attack, unspecified: Secondary | ICD-10-CM | POA: Diagnosis not present

## 2017-10-18 DIAGNOSIS — G301 Alzheimer's disease with late onset: Secondary | ICD-10-CM | POA: Diagnosis not present

## 2017-10-18 DIAGNOSIS — I10 Essential (primary) hypertension: Secondary | ICD-10-CM | POA: Diagnosis not present

## 2017-11-26 DIAGNOSIS — B351 Tinea unguium: Secondary | ICD-10-CM | POA: Diagnosis not present

## 2017-11-26 DIAGNOSIS — M79675 Pain in left toe(s): Secondary | ICD-10-CM | POA: Diagnosis not present

## 2017-11-26 DIAGNOSIS — M79674 Pain in right toe(s): Secondary | ICD-10-CM | POA: Diagnosis not present

## 2018-01-28 DIAGNOSIS — M79674 Pain in right toe(s): Secondary | ICD-10-CM | POA: Diagnosis not present

## 2018-01-28 DIAGNOSIS — M79675 Pain in left toe(s): Secondary | ICD-10-CM | POA: Diagnosis not present

## 2018-01-28 DIAGNOSIS — B351 Tinea unguium: Secondary | ICD-10-CM | POA: Diagnosis not present

## 2018-02-26 DIAGNOSIS — R739 Hyperglycemia, unspecified: Secondary | ICD-10-CM | POA: Diagnosis not present

## 2018-02-26 DIAGNOSIS — Z1331 Encounter for screening for depression: Secondary | ICD-10-CM | POA: Diagnosis not present

## 2018-02-26 DIAGNOSIS — G301 Alzheimer's disease with late onset: Secondary | ICD-10-CM | POA: Diagnosis not present

## 2018-02-26 DIAGNOSIS — Z6833 Body mass index (BMI) 33.0-33.9, adult: Secondary | ICD-10-CM | POA: Diagnosis not present

## 2018-02-26 DIAGNOSIS — R8290 Unspecified abnormal findings in urine: Secondary | ICD-10-CM | POA: Diagnosis not present

## 2018-02-26 DIAGNOSIS — J449 Chronic obstructive pulmonary disease, unspecified: Secondary | ICD-10-CM | POA: Diagnosis not present

## 2018-02-26 DIAGNOSIS — Z0000 Encounter for general adult medical examination without abnormal findings: Secondary | ICD-10-CM | POA: Diagnosis not present

## 2018-02-26 DIAGNOSIS — E785 Hyperlipidemia, unspecified: Secondary | ICD-10-CM | POA: Diagnosis not present

## 2018-02-26 DIAGNOSIS — Z1389 Encounter for screening for other disorder: Secondary | ICD-10-CM | POA: Diagnosis not present

## 2018-02-26 DIAGNOSIS — I10 Essential (primary) hypertension: Secondary | ICD-10-CM | POA: Diagnosis not present

## 2018-02-27 DIAGNOSIS — H10022 Other mucopurulent conjunctivitis, left eye: Secondary | ICD-10-CM | POA: Diagnosis not present

## 2018-04-01 DIAGNOSIS — B351 Tinea unguium: Secondary | ICD-10-CM | POA: Diagnosis not present

## 2018-04-01 DIAGNOSIS — M79674 Pain in right toe(s): Secondary | ICD-10-CM | POA: Diagnosis not present

## 2018-04-01 DIAGNOSIS — M79675 Pain in left toe(s): Secondary | ICD-10-CM | POA: Diagnosis not present

## 2018-06-03 DIAGNOSIS — B351 Tinea unguium: Secondary | ICD-10-CM | POA: Diagnosis not present

## 2018-06-03 DIAGNOSIS — M79674 Pain in right toe(s): Secondary | ICD-10-CM | POA: Diagnosis not present

## 2018-06-03 DIAGNOSIS — M79675 Pain in left toe(s): Secondary | ICD-10-CM | POA: Diagnosis not present

## 2018-07-09 DIAGNOSIS — Z23 Encounter for immunization: Secondary | ICD-10-CM | POA: Diagnosis not present

## 2018-09-09 DIAGNOSIS — B351 Tinea unguium: Secondary | ICD-10-CM | POA: Diagnosis not present

## 2018-09-09 DIAGNOSIS — M79674 Pain in right toe(s): Secondary | ICD-10-CM | POA: Diagnosis not present

## 2018-09-09 DIAGNOSIS — M79675 Pain in left toe(s): Secondary | ICD-10-CM | POA: Diagnosis not present

## 2018-11-11 DIAGNOSIS — M79675 Pain in left toe(s): Secondary | ICD-10-CM | POA: Diagnosis not present

## 2018-11-11 DIAGNOSIS — M79674 Pain in right toe(s): Secondary | ICD-10-CM | POA: Diagnosis not present

## 2018-11-11 DIAGNOSIS — B351 Tinea unguium: Secondary | ICD-10-CM | POA: Diagnosis not present

## 2018-12-29 DIAGNOSIS — T7840XA Allergy, unspecified, initial encounter: Secondary | ICD-10-CM | POA: Diagnosis not present

## 2019-02-19 DIAGNOSIS — B351 Tinea unguium: Secondary | ICD-10-CM | POA: Diagnosis not present

## 2019-02-19 DIAGNOSIS — M79674 Pain in right toe(s): Secondary | ICD-10-CM | POA: Diagnosis not present

## 2019-02-19 DIAGNOSIS — M79675 Pain in left toe(s): Secondary | ICD-10-CM | POA: Diagnosis not present

## 2019-04-23 DIAGNOSIS — B351 Tinea unguium: Secondary | ICD-10-CM | POA: Diagnosis not present

## 2019-04-23 DIAGNOSIS — M79674 Pain in right toe(s): Secondary | ICD-10-CM | POA: Diagnosis not present

## 2019-04-23 DIAGNOSIS — M79675 Pain in left toe(s): Secondary | ICD-10-CM | POA: Diagnosis not present

## 2019-05-07 ENCOUNTER — Other Ambulatory Visit: Payer: Self-pay

## 2019-07-01 DIAGNOSIS — G301 Alzheimer's disease with late onset: Secondary | ICD-10-CM | POA: Diagnosis not present

## 2019-07-01 DIAGNOSIS — Z1389 Encounter for screening for other disorder: Secondary | ICD-10-CM | POA: Diagnosis not present

## 2019-07-01 DIAGNOSIS — Z23 Encounter for immunization: Secondary | ICD-10-CM | POA: Diagnosis not present

## 2019-07-01 DIAGNOSIS — Z1331 Encounter for screening for depression: Secondary | ICD-10-CM | POA: Diagnosis not present

## 2019-07-01 DIAGNOSIS — E785 Hyperlipidemia, unspecified: Secondary | ICD-10-CM | POA: Diagnosis not present

## 2019-07-01 DIAGNOSIS — F339 Major depressive disorder, recurrent, unspecified: Secondary | ICD-10-CM | POA: Diagnosis not present

## 2019-07-07 DIAGNOSIS — B351 Tinea unguium: Secondary | ICD-10-CM | POA: Diagnosis not present

## 2019-07-07 DIAGNOSIS — M79674 Pain in right toe(s): Secondary | ICD-10-CM | POA: Diagnosis not present

## 2019-07-07 DIAGNOSIS — M79675 Pain in left toe(s): Secondary | ICD-10-CM | POA: Diagnosis not present

## 2019-07-22 DIAGNOSIS — I10 Essential (primary) hypertension: Secondary | ICD-10-CM | POA: Diagnosis not present

## 2019-07-22 DIAGNOSIS — E785 Hyperlipidemia, unspecified: Secondary | ICD-10-CM | POA: Diagnosis not present

## 2019-07-28 DIAGNOSIS — H02005 Unspecified entropion of left lower eyelid: Secondary | ICD-10-CM | POA: Diagnosis not present

## 2019-07-28 DIAGNOSIS — J449 Chronic obstructive pulmonary disease, unspecified: Secondary | ICD-10-CM | POA: Diagnosis not present

## 2019-07-28 DIAGNOSIS — I10 Essential (primary) hypertension: Secondary | ICD-10-CM | POA: Diagnosis not present

## 2019-08-17 DIAGNOSIS — H02002 Unspecified entropion of right lower eyelid: Secondary | ICD-10-CM | POA: Diagnosis not present

## 2019-08-17 DIAGNOSIS — Z961 Presence of intraocular lens: Secondary | ICD-10-CM | POA: Diagnosis not present

## 2019-08-17 DIAGNOSIS — H02005 Unspecified entropion of left lower eyelid: Secondary | ICD-10-CM | POA: Diagnosis not present

## 2019-09-08 DIAGNOSIS — M79675 Pain in left toe(s): Secondary | ICD-10-CM | POA: Diagnosis not present

## 2019-09-08 DIAGNOSIS — B351 Tinea unguium: Secondary | ICD-10-CM | POA: Diagnosis not present

## 2019-09-08 DIAGNOSIS — M79674 Pain in right toe(s): Secondary | ICD-10-CM | POA: Diagnosis not present

## 2019-09-10 DIAGNOSIS — U071 COVID-19: Secondary | ICD-10-CM | POA: Diagnosis not present

## 2019-09-10 DIAGNOSIS — Z20828 Contact with and (suspected) exposure to other viral communicable diseases: Secondary | ICD-10-CM | POA: Diagnosis not present

## 2019-09-14 DIAGNOSIS — U071 COVID-19: Secondary | ICD-10-CM | POA: Diagnosis not present

## 2019-09-14 DIAGNOSIS — Z20828 Contact with and (suspected) exposure to other viral communicable diseases: Secondary | ICD-10-CM | POA: Diagnosis not present

## 2019-09-23 ENCOUNTER — Other Ambulatory Visit: Payer: Self-pay

## 2019-09-23 ENCOUNTER — Emergency Department (HOSPITAL_COMMUNITY): Payer: Medicare Other

## 2019-09-23 ENCOUNTER — Emergency Department (HOSPITAL_COMMUNITY)
Admission: EM | Admit: 2019-09-23 | Discharge: 2019-09-23 | Disposition: A | Discharge status: Skilled Nursing Facility | Payer: Medicare Other | Attending: Emergency Medicine | Admitting: Emergency Medicine

## 2019-09-23 ENCOUNTER — Encounter (HOSPITAL_COMMUNITY): Payer: Self-pay | Admitting: Emergency Medicine

## 2019-09-23 DIAGNOSIS — U071 COVID-19: Secondary | ICD-10-CM | POA: Insufficient documentation

## 2019-09-23 DIAGNOSIS — Z79899 Other long term (current) drug therapy: Secondary | ICD-10-CM | POA: Diagnosis not present

## 2019-09-23 DIAGNOSIS — I2510 Atherosclerotic heart disease of native coronary artery without angina pectoris: Secondary | ICD-10-CM | POA: Insufficient documentation

## 2019-09-23 DIAGNOSIS — R05 Cough: Secondary | ICD-10-CM | POA: Diagnosis present

## 2019-09-23 DIAGNOSIS — F0390 Unspecified dementia without behavioral disturbance: Secondary | ICD-10-CM | POA: Insufficient documentation

## 2019-09-23 DIAGNOSIS — Z7982 Long term (current) use of aspirin: Secondary | ICD-10-CM | POA: Diagnosis not present

## 2019-09-23 DIAGNOSIS — Z87891 Personal history of nicotine dependence: Secondary | ICD-10-CM | POA: Diagnosis not present

## 2019-09-23 DIAGNOSIS — N390 Urinary tract infection, site not specified: Secondary | ICD-10-CM | POA: Diagnosis not present

## 2019-09-23 DIAGNOSIS — I10 Essential (primary) hypertension: Secondary | ICD-10-CM | POA: Insufficient documentation

## 2019-09-23 DIAGNOSIS — J449 Chronic obstructive pulmonary disease, unspecified: Secondary | ICD-10-CM | POA: Diagnosis not present

## 2019-09-23 DIAGNOSIS — Z8673 Personal history of transient ischemic attack (TIA), and cerebral infarction without residual deficits: Secondary | ICD-10-CM | POA: Insufficient documentation

## 2019-09-23 LAB — URINALYSIS, ROUTINE W REFLEX MICROSCOPIC
Bilirubin Urine: NEGATIVE
Glucose, UA: NEGATIVE mg/dL
Hgb urine dipstick: NEGATIVE
Ketones, ur: NEGATIVE mg/dL
Nitrite: NEGATIVE
Protein, ur: 30 mg/dL — AB
Specific Gravity, Urine: 1.025 (ref 1.005–1.030)
pH: 5 (ref 5.0–8.0)

## 2019-09-23 LAB — COMPREHENSIVE METABOLIC PANEL
ALT: 21 U/L (ref 0–44)
AST: 39 U/L (ref 15–41)
Albumin: 3.6 g/dL (ref 3.5–5.0)
Alkaline Phosphatase: 69 U/L (ref 38–126)
Anion gap: 12 (ref 5–15)
BUN: 19 mg/dL (ref 8–23)
CO2: 25 mmol/L (ref 22–32)
Calcium: 9.4 mg/dL (ref 8.9–10.3)
Chloride: 101 mmol/L (ref 98–111)
Creatinine, Ser: 0.76 mg/dL (ref 0.44–1.00)
GFR calc Af Amer: >60 mL/min (ref >60)
GFR calc non Af Amer: >60 mL/min (ref >60)
Glucose, Bld: 93 mg/dL (ref 70–99)
Potassium: 4 mmol/L (ref 3.5–5.1)
Sodium: 138 mmol/L (ref 135–145)
Total Bilirubin: 0.5 mg/dL (ref 0.3–1.2)
Total Protein: 7 g/dL (ref 6.5–8.1)

## 2019-09-23 LAB — CBC WITH DIFFERENTIAL/PLATELET
Abs Immature Granulocytes: 0.01 10*3/uL (ref 0.00–0.07)
Basophils Absolute: 0 10*3/uL (ref 0.0–0.1)
Basophils Relative: 1 %
Eosinophils Absolute: 0 10*3/uL (ref 0.0–0.5)
Eosinophils Relative: 0 %
HCT: 39.1 % (ref 36.0–46.0)
Hemoglobin: 12.8 g/dL (ref 12.0–15.0)
Immature Granulocytes: 0 %
Lymphocytes Relative: 33 %
Lymphs Abs: 1.3 10*3/uL (ref 0.7–4.0)
MCH: 34.4 pg — ABNORMAL HIGH (ref 26.0–34.0)
MCHC: 32.7 g/dL (ref 30.0–36.0)
MCV: 105.1 fL — ABNORMAL HIGH (ref 80.0–100.0)
Monocytes Absolute: 0.5 10*3/uL (ref 0.1–1.0)
Monocytes Relative: 12 %
Neutro Abs: 2.2 10*3/uL (ref 1.7–7.7)
Neutrophils Relative %: 54 %
Platelets: 148 10*3/uL — ABNORMAL LOW (ref 150–400)
RBC: 3.72 MIL/uL — ABNORMAL LOW (ref 3.87–5.11)
RDW: 11.9 % (ref 11.5–15.5)
WBC: 4 10*3/uL (ref 4.0–10.5)
nRBC: 0 % (ref 0.0–0.2)

## 2019-09-23 LAB — AMMONIA: Ammonia: 14 umol/L (ref 9–35)

## 2019-09-23 MED ORDER — SODIUM CHLORIDE 0.9 % IV SOLN
1.0000 g | Freq: Once | INTRAVENOUS | Status: AC
  Administered 2019-09-23: 1 g via INTRAVENOUS
  Filled 2019-09-23: qty 10

## 2019-09-23 MED ORDER — CEPHALEXIN 500 MG PO CAPS: 500.0000 mg | ORAL_CAPSULE | Freq: Two times a day (BID) | ORAL | 0 refills | Status: AC

## 2019-09-23 NOTE — Discharge Instructions (Signed)
Your testing today shows no signs of stroke, no signs of pneumonia, your blood work was unremarkable but your urinary test showed an infection.  A culture is pending.  Take Keflex twice a day for 7 days.  Seek medical exam for severe or worsening symptoms including increasing vomiting fever or weakness

## 2019-09-23 NOTE — ED Notes (Signed)
Pt provided with dry brief, placed on purewick and repositioned to provide comfort while waiting for transport.

## 2019-09-23 NOTE — ED Triage Notes (Signed)
PT brought in by [facility] from [facility] today. [facility] reports pt was Covid + on Monday this. Staff there reported to EMS patient was standing up and got weak and was lowered to the floor today. PT resistive to care at times and has dementia but able to respond to verbal stimuli and answer some questions appropriately. CBG 163. Oxygen sat 99% on room air and congested cough noted on arrival.

## 2019-09-23 NOTE — ED Notes (Signed)
Called report to [facility] and updated supervisor on pt discharge. Currently waiting on transport back to facility. Pt resting comfortably at this time.

## 2019-09-23 NOTE — ED Notes (Signed)
[facility] arrived to transport pt back to facility. Pt states that she is ambulatory. EMS states per protocol if pt is ambulatory or can sit in wheelchair pt is unable to be transported by EMS for discharge. Spoke with Glenard Haring at [facility], states "since dx with covid, pt has not been ambulatory but is able to sit in wheelchair." This nurse informed Glenard Haring that according to EMS policy [facility] will be responsible for providing discharge transportation. Glenard Haring states "It will be awhile. I have to see who drove to work that can come pick her up." Pt resting comfortably at this time.

## 2019-09-23 NOTE — ED Provider Notes (Signed)
[facility] EMERGENCY DEPARTMENT Provider Note   CSN: [id number] Arrival date & time: 09/23/19  1255     History Chief Complaint  Patient presents with  . Cough    Susan Fowler is a 68 y.o. female.  HPI   This patient is a 68 year old female with a known history of dementia, she is coming from [facility] she currently resides.  Reportedly the patient tested positive for coronavirus on Monday, 48 hours ago.  Report from the nursing facility is that the patient was not her normal self today.  She did not seem to be responding to staff like she normally does.  When they tried to get her up she fell.  There was no injuries from the fall.  They called for transport due to altered mental status.  It is unclear when the pt was last seen normal.  Paramedics were additional historians and report that the patient had normal vital signs in route to the hospital with no hypoxia fever or tachycardia.  Level 5 caveat applies secondary to altered mental status and dementia  Past Medical History:  Diagnosis Date  . Bright's disease    Reportedly urethral transplant, single functioning kidney  . COPD (chronic obstructive pulmonary disease) (HCC)   . Coronary atherosclerosis of native coronary artery    Nonobstructive at catheterization 2004  . Dementia (HCC)   . Essential hypertension, benign   . Hyperthyroidism   . Mixed hyperlipidemia   . Peptic ulcer disease   . Shortness of breath   . TIA (transient ischemic attack)     Patient Active Problem List   Diagnosis Date Noted  . Generalized anxiety disorder 12/15/2014  . Precordial pain 06/23/2012  . Coronary atherosclerosis of native coronary artery 06/23/2012  . Tobacco abuse 06/23/2012  . Essential hypertension, benign 06/23/2012  . Mixed hyperlipidemia 06/23/2012    Past Surgical History:  Procedure Laterality Date  . ABDOMINAL HYSTERECTOMY    . CARPAL TUNNEL RELEASE Bilateral   . CATARACT  EXTRACTION W/PHACO Left 11/16/2013   Procedure: CATARACT EXTRACTION PHACO AND INTRAOCULAR LENS PLACEMENT (IOC);  Surgeon: Gemma Payor, MD;  Location: [facility];  Service: Ophthalmology;  Laterality: Left;  CDE:  8.65  . CATARACT EXTRACTION W/PHACO Right 11/30/2013   Procedure: CATARACT EXTRACTION PHACO AND INTRAOCULAR LENS PLACEMENT (IOC);  Surgeon: Gemma Payor, MD;  Location: [facility];  Service: Ophthalmology;  Laterality: Right;  CDE:7.50  . HEMORRHOID SURGERY    . LUMBAR SPINE SURGERY    . Urethral transplant       OB History   No obstetric history on file.     Family History  Problem Relation Age of Onset  . Alcohol abuse Brother   . Depression Son   . Alcohol abuse Son   . Drug abuse Son   . Alcohol abuse Son   . Diabetes type II Other   . Hypertension Other   . Cancer Other   . Coronary artery disease Mother   . Dementia Mother   . Dementia Father   . Alcohol abuse Brother     Social History   Tobacco Use  . Smoking status: Former Smoker    Packs/day: 1.00    Years: 50.00    Pack years: 50.00    Types: Cigarettes  . Smokeless tobacco: Never Used  . Tobacco comment: Quit 10-08-2014 per Pt  Substance Use Topics  . Alcohol use: No  . Drug use: No    Home Medications Prior to  Admission medications   Medication Sig Start Date End Date Taking? Authorizing Provider  ALPRAZolam Prudy Feeler(XANAX) 0.5 MG tablet Take 1 tablet (0.5 mg total) by mouth 3 (three) times daily. Patient taking differently: Take 0.5 mg by mouth at bedtime.  12/15/14  Yes Myrlene Brokeross, Deborah R, MD  aspirin EC 81 MG tablet Take 81 mg by mouth daily.   Yes [provider]  Cholecalciferol (VITAMIN D) 2000 units CAPS Take 1 capsule by mouth daily.   Yes [provider]  citalopram (CELEXA) 20 MG tablet Take 20 mg by mouth daily. 10/20/14  Yes [provider]  Melatonin 3 MG TABS Take 3 mg by mouth at bedtime.   Yes [provider]  Memantine HCl-Donepezil HCl (NAMZARIC) 28-10 MG CP24 Take 1  capsule by mouth daily.   Yes [provider]  simvastatin (ZOCOR) 40 MG tablet Take 40 mg by mouth daily. 11/23/14  Yes [provider]  Vitamin D, Ergocalciferol, (DRISDOL) 50000 UNITS CAPS capsule Take 50,000 Units by mouth once a week. 11/15/14  Yes [provider]  vitamin E 400 UNIT capsule Take 400 Units by mouth daily.   Yes [provider]  cephALEXin (KEFLEX) 500 MG capsule Take 1 capsule (500 mg total) by mouth 2 (two) times daily for 7 days. 09/23/19 09/30/19  Eber HongMiller, Mattix Imhof, MD    Allergies    Patient has no known allergies.  Review of Systems   Review of Systems  Unable to perform ROS: Dementia    Physical Exam Updated Vital Signs BP (!) 111/59   Pulse (!) 49   Temp 98.6 F (37 C) (Rectal)   Resp 15   Wt 79.4 kg   SpO2 100%   BMI 29.12 kg/m   Physical Exam Vitals and nursing note reviewed.  Constitutional:      General: She is not in acute distress.    Appearance: She is well-developed.     Comments: Somnolent but arousable to voice  HENT:     Head: Normocephalic and atraumatic.     Mouth/Throat:     Pharynx: No oropharyngeal exudate.  Eyes:     General: No scleral icterus.       Right eye: No discharge.        Left eye: No discharge.     Extraocular Movements: Extraocular movements intact.     Conjunctiva/sclera: Conjunctivae normal.     Comments: Pinpoint pupils bilaterally  Neck:     Thyroid: No thyromegaly.     Vascular: No JVD.  Cardiovascular:     Rate and Rhythm: Normal rate and regular rhythm.     Heart sounds: Normal heart sounds. No murmur. No friction rub. No gallop.   Pulmonary:     Effort: Pulmonary effort is normal. No respiratory distress.     Breath sounds: Normal breath sounds. No wheezing or rales.  Abdominal:     General: Bowel sounds are normal. There is no distension.     Palpations: Abdomen is soft. There is no mass.     Tenderness: There is no abdominal tenderness.  Musculoskeletal:         General: No tenderness. Normal range of motion.     Cervical back: Normal range of motion and neck supple.     Comments: The patient has increased tone of her right arm, keeps this arm in flexion, her left arm is also having increasing tone but able to straighten with some assistance.  She is able to squeeze with both of  her hands, she moves both of her legs.  Lymphadenopathy:     Cervical: No cervical adenopathy.  Skin:    General: Skin is warm and dry.     Findings: No erythema or rash.     Comments: No significant rashes or skin breakdown  Neurological:     Coordination: Coordination normal.     Comments: The patient is able to follow commands including gripping with her hands, opening her eyes, opening her mouth, says the occasional words but not answering questions appropriately per her baseline.  Her level of alertness is depressed but she does open her eyes when you call her name  Psychiatric:        Behavior: Behavior normal.     ED Results / Procedures / Treatments   Labs (all labs ordered are listed, but only abnormal results are displayed) Labs Reviewed  CBC WITH DIFFERENTIAL/PLATELET - Abnormal; Notable for the following components:      Result Value   RBC 3.72 (*)    MCV 105.1 (*)    MCH 34.4 (*)    Platelets 148 (*)    All other components within normal limits  URINALYSIS, ROUTINE W REFLEX MICROSCOPIC - Abnormal; Notable for the following components:   Color, Urine AMBER (*)    APPearance CLOUDY (*)    Protein, ur 30 (*)    Leukocytes,Ua TRACE (*)    Bacteria, UA MANY (*)    All other components within normal limits  URINE CULTURE  COMPREHENSIVE METABOLIC PANEL  AMMONIA    EKG None  Radiology CT Head Wo Contrast  Result Date: 09/23/2019 CLINICAL DATA:  Weakness today. Encephalopathy. EXAM: CT HEAD WITHOUT CONTRAST TECHNIQUE: Contiguous axial images were obtained from the base of the skull through the vertex without intravenous contrast. COMPARISON:  None.  FINDINGS: Brain: Mildly enlarged ventricles and cortical sulci. Mild patchy white matter low density in both cerebral hemispheres. No intracranial hemorrhage, mass lesion or CT evidence of acute infarction. Vascular: No hyperdense vessel or unexpected calcification. Skull: Bilateral hyperostosis frontalis. Sinuses/Orbits: Status post bilateral cataract extraction. Unremarkable bones and included paranasal sinuses. Other: Small, oval left frontal scalp lesion. IMPRESSION: 1. No acute abnormality. 2. Mild diffuse cerebral and cerebellar atrophy. 3. Mild chronic small vessel white matter ischemic changes in both cerebral hemispheres. Electronically Signed   By: Beckie Salts M.D.   On: 09/23/2019 14:50   DG Chest Port 1 View  Result Date: 09/23/2019 CLINICAL DATA:  Altered mental status. EXAM: PORTABLE CHEST 1 VIEW COMPARISON:  09/30/2017. FINDINGS: Normal sized heart. Tortuous and calcified thoracic aorta. Clear lungs. Unremarkable bones. IMPRESSION: No acute abnormality. Electronically Signed   By: Beckie Salts M.D.   On: 09/23/2019 14:04    Procedures Procedures (including critical care time)  Medications Ordered in ED Medications  cefTRIAXone (ROCEPHIN) 1 g in sodium chloride 0.9 % 100 mL IVPB (has no administration in time range)    ED Course  I have reviewed the triage vital signs and the nursing notes.  Pertinent labs & imaging results that were available during my care of the patient were reviewed by me and considered in my medical decision making (see chart for details).  Clinical Course as of Sep 22 1516  Wed Sep 23, 2019  1457 CT negative for stroke / bleed / mass UA positive for UTI and CBC reassuring CMP pending. Rocephin given, stable for d/c after meds.   [BM]  1514 The metabolic panel is unremarkable, the patient's blood pressure has been  stable, no tachycardia, overall stable for discharge   [BM]  1514 Nurse reports that temperature was measured at 98.6   [BM]    Clinical  Course User Index [BM] Noemi Chapel, MD   MDM Rules/Calculators/A&P                      The patient's baseline is demented, it is unclear how far from her baseline she actually is.  We know that she is positive for coronavirus per their report, will check some labs urinalysis and a chest x-ray.  The patient does not appear to be in distress and has no abnormal vital signs.  She has reportedly had a transient ischemic attack in the past.  She does seem to have some asymmetry with her upper extremity with the right arm being in a flexion contracture.  Final Clinical Impression(s) / ED Diagnoses Final diagnoses:  Urinary tract infection without hematuria, site unspecified    Rx / DC Orders ED Discharge Orders         Ordered    cephALEXin (KEFLEX) 500 MG capsule  2 times daily     09/23/19 1515           Noemi Chapel, MD 09/23/19 1517

## 2019-09-23 NOTE — ED Notes (Addendum)
Pt has been discharged. Awaiting EMS to trasport

## 2019-09-26 LAB — URINE CULTURE: Culture: >=100000 — AB

## 2019-09-27 ENCOUNTER — Telehealth: Payer: Self-pay | Admitting: *Deleted

## 2019-09-27 NOTE — Telephone Encounter (Signed)
Post ED Visit - Positive Culture Follow-up  Culture report reviewed by antimicrobial stewardship pharmacist: [place] Team []  Elenor Quinones, Pharm.D. []  Heide Guile, Pharm.D., BCPS AQ-ID []  Parks Neptune, Pharm.D., BCPS []  Alycia Rossetti, Pharm.D., BCPS []  [place], Pharm.D., BCPS, AAHIVP []  Legrand Como, Pharm.D., BCPS, AAHIVP []  Salome Arnt, PharmD, BCPS []  Johnnette Gourd, PharmD, BCPS []  Hughes Better, PharmD, BCPS []  Leeroy Cha, PharmD []  Laqueta Linden, PharmD, BCPS []  Albertina Parr, PharmD  [place] Team []  Leodis Sias, PharmD []  Lindell Spar, PharmD []  Royetta Asal, PharmD []  Graylin Shiver, Rph []  Rema Fendt) Glennon Mac, PharmD []  Arlyn Dunning, PharmD []  Netta Cedars, PharmD []  Dia Sitter, PharmD []  Leone Haven, PharmD []  Gretta Arab, PharmD []  Theodis Shove, PharmD []  Peggyann Juba, PharmD []  Reuel Boom, PharmD   Positive urine culture, reviewed by Elicia Lamp Treated with Cephalexin, organism sensitive to the same and no further patient follow-up is required at this time.  Harlon Flor Talley 09/27/2019, 1:20 PM

## 2019-10-22 DIAGNOSIS — R278 Other lack of coordination: Secondary | ICD-10-CM | POA: Diagnosis not present

## 2019-10-22 DIAGNOSIS — M79641 Pain in right hand: Secondary | ICD-10-CM | POA: Diagnosis not present

## 2019-10-22 DIAGNOSIS — F0390 Unspecified dementia without behavioral disturbance: Secondary | ICD-10-CM | POA: Diagnosis not present

## 2019-10-23 ENCOUNTER — Encounter (HOSPITAL_COMMUNITY): Payer: Self-pay | Admitting: Emergency Medicine

## 2019-10-23 ENCOUNTER — Emergency Department (HOSPITAL_COMMUNITY): Payer: Medicare Other

## 2019-10-23 ENCOUNTER — Emergency Department (HOSPITAL_COMMUNITY)
Admission: EM | Admit: 2019-10-23 | Discharge: 2019-10-23 | Disposition: A | Discharge status: Home / Self Care | Payer: Medicare Other | Attending: Emergency Medicine | Admitting: Emergency Medicine

## 2019-10-23 DIAGNOSIS — Y92129 Unspecified place in nursing home as the place of occurrence of the external cause: Secondary | ICD-10-CM | POA: Diagnosis not present

## 2019-10-23 DIAGNOSIS — Z79899 Other long term (current) drug therapy: Secondary | ICD-10-CM | POA: Diagnosis not present

## 2019-10-23 DIAGNOSIS — Z87891 Personal history of nicotine dependence: Secondary | ICD-10-CM | POA: Insufficient documentation

## 2019-10-23 DIAGNOSIS — R220 Localized swelling, mass and lump, head: Secondary | ICD-10-CM | POA: Diagnosis not present

## 2019-10-23 DIAGNOSIS — R58 Hemorrhage, not elsewhere classified: Secondary | ICD-10-CM | POA: Diagnosis not present

## 2019-10-23 DIAGNOSIS — I10 Essential (primary) hypertension: Secondary | ICD-10-CM | POA: Diagnosis not present

## 2019-10-23 DIAGNOSIS — S0990XA Unspecified injury of head, initial encounter: Secondary | ICD-10-CM | POA: Diagnosis present

## 2019-10-23 DIAGNOSIS — F0390 Unspecified dementia without behavioral disturbance: Secondary | ICD-10-CM | POA: Diagnosis not present

## 2019-10-23 DIAGNOSIS — Y939 Activity, unspecified: Secondary | ICD-10-CM | POA: Insufficient documentation

## 2019-10-23 DIAGNOSIS — W19XXXA Unspecified fall, initial encounter: Secondary | ICD-10-CM

## 2019-10-23 DIAGNOSIS — X58XXXA Exposure to other specified factors, initial encounter: Secondary | ICD-10-CM | POA: Diagnosis not present

## 2019-10-23 DIAGNOSIS — S0081XA Abrasion of other part of head, initial encounter: Secondary | ICD-10-CM | POA: Diagnosis not present

## 2019-10-23 DIAGNOSIS — R609 Edema, unspecified: Secondary | ICD-10-CM | POA: Diagnosis not present

## 2019-10-23 DIAGNOSIS — S161XXA Strain of muscle, fascia and tendon at neck level, initial encounter: Secondary | ICD-10-CM | POA: Diagnosis not present

## 2019-10-23 DIAGNOSIS — S066X0A Traumatic subarachnoid hemorrhage without loss of consciousness, initial encounter: Secondary | ICD-10-CM | POA: Diagnosis not present

## 2019-10-23 DIAGNOSIS — I609 Nontraumatic subarachnoid hemorrhage, unspecified: Secondary | ICD-10-CM

## 2019-10-23 DIAGNOSIS — Y999 Unspecified external cause status: Secondary | ICD-10-CM | POA: Diagnosis not present

## 2019-10-23 DIAGNOSIS — M47812 Spondylosis without myelopathy or radiculopathy, cervical region: Secondary | ICD-10-CM | POA: Diagnosis not present

## 2019-10-23 DIAGNOSIS — Z7982 Long term (current) use of aspirin: Secondary | ICD-10-CM | POA: Diagnosis not present

## 2019-10-23 DIAGNOSIS — J449 Chronic obstructive pulmonary disease, unspecified: Secondary | ICD-10-CM | POA: Insufficient documentation

## 2019-10-23 DIAGNOSIS — I2510 Atherosclerotic heart disease of native coronary artery without angina pectoris: Secondary | ICD-10-CM | POA: Diagnosis not present

## 2019-10-23 NOTE — Consult Note (Signed)
I was contacted by Dr. Judd Lien regarding this patient.  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  She is on 81 mg of aspirin.   It sounds like she is at her baseline.  I have reviewed her head CT which demonstrates a small traumatic left subarachnoid hemorrhage.  I do not think any further workup or intervention is warranted.

## 2019-10-23 NOTE — ED Notes (Signed)
Spoke with someone from [facility] and she stated patient does not have a DNR and her next of kin is her son Matelyn Antonelli [phone].

## 2019-10-23 NOTE — Discharge Instructions (Addendum)
Continue medications as previously prescribed.  Return to the emergency department for severe headache, difficulty waking, seizure activity, or other new and concerning symptoms. 

## 2019-10-23 NOTE — ED Notes (Signed)
Cleaned patient's wound and dry blood on left side of face. Applied sterile gauze and wrapped in gauze. Patient tolerated well.

## 2019-10-23 NOTE — ED Provider Notes (Signed)
[facility] EMERGENCY DEPARTMENT Provider Note   CSN: [id number] Arrival date & time: 10/23/19  0920     History Chief Complaint  Patient presents with  . Fall    Susan Fowler is a 69 y.o. female.  Patient is a 69 year old female with history of dementia, TIA, COPD, and Bright's disease.  She is brought by EMS from her extended care facility for evaluation of fall.  She was apparently found on the floor bleeding from the left temple.  Patient can add no additional history secondary to dementia.  The history is provided by the patient.  Fall This is a new problem. The current episode started 1 to 2 hours ago. The problem has not changed since onset.      Past Medical History:  Diagnosis Date  . Bright's disease    Reportedly urethral transplant, single functioning kidney  . COPD (chronic obstructive pulmonary disease) (HCC)   . Coronary atherosclerosis of native coronary artery    Nonobstructive at catheterization 2004  . Dementia (HCC)   . Essential hypertension, benign   . Hyperthyroidism   . Mixed hyperlipidemia   . Peptic ulcer disease   . Shortness of breath   . TIA (transient ischemic attack)     Patient Active Problem List   Diagnosis Date Noted  . Generalized anxiety disorder 12/15/2014  . Precordial pain 06/23/2012  . Coronary atherosclerosis of native coronary artery 06/23/2012  . Tobacco abuse 06/23/2012  . Essential hypertension, benign 06/23/2012  . Mixed hyperlipidemia 06/23/2012    Past Surgical History:  Procedure Laterality Date  . ABDOMINAL HYSTERECTOMY    . CARPAL TUNNEL RELEASE Bilateral   . CATARACT EXTRACTION W/PHACO Left 11/16/2013   Procedure: CATARACT EXTRACTION PHACO AND INTRAOCULAR LENS PLACEMENT (IOC);  Surgeon: Gemma Payor, MD;  Location: [facility];  Service: Ophthalmology;  Laterality: Left;  CDE:  8.65  . CATARACT EXTRACTION W/PHACO Right 11/30/2013   Procedure: CATARACT EXTRACTION PHACO AND INTRAOCULAR LENS PLACEMENT (IOC);   Surgeon: Gemma Payor, MD;  Location: [facility];  Service: Ophthalmology;  Laterality: Right;  CDE:7.50  . HEMORRHOID SURGERY    . LUMBAR SPINE SURGERY    . Urethral transplant       OB History   No obstetric history on file.     Family History  Problem Relation Age of Onset  . Alcohol abuse Brother   . Depression Son   . Alcohol abuse Son   . Drug abuse Son   . Alcohol abuse Son   . Diabetes type II Other   . Hypertension Other   . Cancer Other   . Coronary artery disease Mother   . Dementia Mother   . Dementia Father   . Alcohol abuse Brother     Social History   Tobacco Use  . Smoking status: Former Smoker    Packs/day: 1.00    Years: 50.00    Pack years: 50.00    Types: Cigarettes  . Smokeless tobacco: Never Used  . Tobacco comment: Quit 10-08-2014 per Pt  Substance Use Topics  . Alcohol use: No  . Drug use: No    Home Medications Prior to Admission medications   Medication Sig Start Date End Date Taking? Authorizing Provider  ALPRAZolam Prudy Feeler) 0.5 MG tablet Take 1 tablet (0.5 mg total) by mouth 3 (three) times daily. Patient taking differently: Take 0.5 mg by mouth at bedtime.  12/15/14   Myrlene Broker, MD  aspirin EC 81 MG tablet Take 81 mg by  mouth daily.    [provider]  Cholecalciferol (VITAMIN D) 2000 units CAPS Take 1 capsule by mouth daily.    [provider]  citalopram (CELEXA) 20 MG tablet Take 20 mg by mouth daily. 10/20/14   [provider]  Melatonin 3 MG TABS Take 3 mg by mouth at bedtime.    [provider]  Memantine HCl-Donepezil HCl (NAMZARIC) 28-10 MG CP24 Take 1 capsule by mouth daily.    [provider]  simvastatin (ZOCOR) 40 MG tablet Take 40 mg by mouth daily. 11/23/14   [provider]  Vitamin D, Ergocalciferol, (DRISDOL) 50000 UNITS CAPS capsule Take 50,000 Units by mouth once a week. 11/15/14   [provider]  vitamin E 400 UNIT capsule Take 400 Units by mouth daily.     [provider]    Allergies    Patient has no known allergies.  Review of Systems   Review of Systems  Unable to perform ROS: Dementia    Physical Exam Updated Vital Signs Ht 5\' 3"  (1.6 m)   BMI 31.00 kg/m   Physical Exam Vitals and nursing note reviewed.  Constitutional:      General: She is not in acute distress.    Appearance: She is well-developed. She is not diaphoretic.     Comments: Patient is awake and alert.  She appears confused and only mumbles incoherent sounds.  It sounds as though this is her baseline.  HENT:     Head: Normocephalic.     Comments: There is a 1.5 cm superficial abrasion/laceration to the left temple.  There is some swelling underlying the laceration, but no palpable defect. Eyes:     Extraocular Movements: Extraocular movements intact.     Pupils: Pupils are equal, round, and reactive to light.  Neck:     Comments: There is no cervical spine tenderness or step-off. Cardiovascular:     Rate and Rhythm: Normal rate and regular rhythm.     Heart sounds: No murmur. No friction rub. No gallop.   Pulmonary:     Effort: Pulmonary effort is normal. No respiratory distress.     Breath sounds: Normal breath sounds. No wheezing.  Abdominal:     General: Bowel sounds are normal. There is no distension.     Palpations: Abdomen is soft.     Tenderness: There is no abdominal tenderness.  Musculoskeletal:        General: Normal range of motion.     Cervical back: Normal range of motion and neck supple.  Skin:    General: Skin is warm and dry.  Neurological:     Mental Status: She is alert.     Comments: Neurologic exam is difficult to carry out secondary to dementia.  She does move all extremities.  She attempts to speak, however speech is incomprehensible and garbled.     ED Results / Procedures / Treatments   Labs (all labs ordered are listed, but only abnormal results are displayed) Labs Reviewed - No data to  display  EKG None  Radiology No results found.  Procedures Procedures (including critical care time)  Medications Ordered in ED Medications - No data to display  ED Course  I have reviewed the triage vital signs and the nursing notes.  Pertinent labs & imaging results that were available during my care of the patient were reviewed by me and considered in my medical decision making (see chart for details).    MDM Rules/Calculators/A&P  Patient  sent from her extended care facility for evaluation of fall. Patient was found on the floor with a small laceration to the left temple. Patient has history of dementia and severe cognitive decline.  Her CT scan shows what appears to be a small area of subarachnoid hemorrhage along the left cerebral convexity. Scan was negative for cervical spine fracture.  CT findings and clinical situation discussed with Dr. Arnoldo Morale from neurosurgery. He does not feel as though this is a neurosurgical issue and does not require intervention.  Patient will require observation, however I feel that this can be performed in her extended care facility rather than the inpatient setting. She will be discharged, to be returned as needed for any problems.  Final Clinical Impression(s) / ED Diagnoses Final diagnoses:  None    Rx / DC Orders ED Discharge Orders    None       Veryl Speak, MD 10/23/19 1152

## 2019-10-23 NOTE — ED Triage Notes (Signed)
Patient brought in by EMS from [facility] for unwitnessed fall. States they found patient in floor this morning. She has laceration noted to left forehead. Bleeding controlled. Patient alert but has dementia.

## 2019-10-27 DIAGNOSIS — R278 Other lack of coordination: Secondary | ICD-10-CM | POA: Diagnosis not present

## 2019-10-27 DIAGNOSIS — E785 Hyperlipidemia, unspecified: Secondary | ICD-10-CM | POA: Diagnosis not present

## 2019-10-27 DIAGNOSIS — G459 Transient cerebral ischemic attack, unspecified: Secondary | ICD-10-CM | POA: Diagnosis not present

## 2019-10-27 DIAGNOSIS — M79641 Pain in right hand: Secondary | ICD-10-CM | POA: Diagnosis not present

## 2019-10-27 DIAGNOSIS — F0390 Unspecified dementia without behavioral disturbance: Secondary | ICD-10-CM | POA: Diagnosis not present

## 2019-10-28 DIAGNOSIS — H10423 Simple chronic conjunctivitis, bilateral: Secondary | ICD-10-CM | POA: Diagnosis not present

## 2019-11-02 ENCOUNTER — Emergency Department (HOSPITAL_COMMUNITY)
Admission: EM | Admit: 2019-11-02 | Discharge: 2019-11-03 | Disposition: A | Discharge status: Home / Self Care | Payer: Medicare Other | Attending: Emergency Medicine | Admitting: Emergency Medicine

## 2019-11-02 ENCOUNTER — Other Ambulatory Visit: Payer: Self-pay

## 2019-11-02 ENCOUNTER — Encounter (HOSPITAL_COMMUNITY): Payer: Self-pay

## 2019-11-02 ENCOUNTER — Emergency Department (HOSPITAL_COMMUNITY): Payer: Medicare Other

## 2019-11-02 DIAGNOSIS — Y92129 Unspecified place in nursing home as the place of occurrence of the external cause: Secondary | ICD-10-CM | POA: Diagnosis not present

## 2019-11-02 DIAGNOSIS — S199XXA Unspecified injury of neck, initial encounter: Secondary | ICD-10-CM | POA: Diagnosis not present

## 2019-11-02 DIAGNOSIS — I10 Essential (primary) hypertension: Secondary | ICD-10-CM | POA: Diagnosis not present

## 2019-11-02 DIAGNOSIS — I2510 Atherosclerotic heart disease of native coronary artery without angina pectoris: Secondary | ICD-10-CM | POA: Insufficient documentation

## 2019-11-02 DIAGNOSIS — R220 Localized swelling, mass and lump, head: Secondary | ICD-10-CM | POA: Diagnosis not present

## 2019-11-02 DIAGNOSIS — Z79899 Other long term (current) drug therapy: Secondary | ICD-10-CM | POA: Insufficient documentation

## 2019-11-02 DIAGNOSIS — Z7982 Long term (current) use of aspirin: Secondary | ICD-10-CM | POA: Insufficient documentation

## 2019-11-02 DIAGNOSIS — F0390 Unspecified dementia without behavioral disturbance: Secondary | ICD-10-CM | POA: Insufficient documentation

## 2019-11-02 DIAGNOSIS — Z87891 Personal history of nicotine dependence: Secondary | ICD-10-CM | POA: Diagnosis not present

## 2019-11-02 DIAGNOSIS — J449 Chronic obstructive pulmonary disease, unspecified: Secondary | ICD-10-CM | POA: Insufficient documentation

## 2019-11-02 DIAGNOSIS — S0990XA Unspecified injury of head, initial encounter: Secondary | ICD-10-CM | POA: Diagnosis not present

## 2019-11-02 DIAGNOSIS — W19XXXA Unspecified fall, initial encounter: Secondary | ICD-10-CM | POA: Diagnosis not present

## 2019-11-02 DIAGNOSIS — Y939 Activity, unspecified: Secondary | ICD-10-CM | POA: Insufficient documentation

## 2019-11-02 DIAGNOSIS — Y999 Unspecified external cause status: Secondary | ICD-10-CM | POA: Insufficient documentation

## 2019-11-02 DIAGNOSIS — S0003XA Contusion of scalp, initial encounter: Secondary | ICD-10-CM | POA: Insufficient documentation

## 2019-11-02 DIAGNOSIS — R404 Transient alteration of awareness: Secondary | ICD-10-CM | POA: Diagnosis not present

## 2019-11-02 NOTE — ED Notes (Signed)
Called [facility] to Pick Pt up.  Per Supervisor , "it may be awhile, because they are serving Dinner".  Nurse informed.

## 2019-11-02 NOTE — Discharge Instructions (Addendum)
You have been seen today for falling out of bed. Please read and follow all provided instructions. Return to the emergency room for worsening condition or new concerning symptoms including change in mental status.  The CT scan of Mrs. Boyden head and neck were without signs of acute injury.  Also it shows resolution of small volume left side subarachnoid hemorrhage from 10 days ago.   1. Medications:  Continue usual home medications Take medications as prescribed. Please review all of the medicines and only take them if you do not have an allergy to them.   2. Treatment: rest, drink plenty of fluids  3. Follow Up:  Please follow up with primary care provider by scheduling an appointment as soon as possible for a visit     ?

## 2019-11-02 NOTE — ED Triage Notes (Signed)
Ems reports pt is a resident of [facility].  Reports pt has dementia and staff found her on the floor in her room.  Unwitnessed fall.   Swelling noted to back of head.  Pt alert, mental status at baseline per ems.  Pt nonverbal at this time.  Pt opens eyes and looking around room but not speaking.  Pt pulls away when ems attempted to put on bp cuff.  Pt also feel a couple of weeks ago.

## 2019-11-02 NOTE — ED Provider Notes (Addendum)
[facility] Provider Note   CSN: [id number] Arrival date & time: 11/02/19  1430     History Chief Complaint  Patient presents with  . Fall    Susan Fowler is a 69 y.o. female with past medical history significant for COPD, CAD, dementia, hypertension, hypothyroidism, TIA presents to emergency department today via EMS after mechanical fall.  Patient is a resident at [facility].  Patient is nonverbal at baseline and with her dementia is unable to provide history, level 5 caveat applies.  Staff from [facility] was able to provide history.  RN states patient was put in her bed after lunch.  When she was checked on 5 to 10 minutes later she was found on the floor.  She landed on her back.  She was conscious.  There was no bleeding or obvious deformity seen.  She was sent here for further evaluation. RN also reports patient was at her baseline at time of transport. Patient is not anticoagulated.  Chart review shows patient did have a fall on 10/23/2019.  She had a CT scan that showed a subarachnoid hemorrhage.  She was discharged back to facility as there was no surgical intervention warranted.  Past Medical History:  Diagnosis Date  . Bright's disease    Reportedly urethral transplant, single functioning kidney  . COPD (chronic obstructive pulmonary disease) ([place])   . Coronary atherosclerosis of native coronary artery    Nonobstructive at catheterization 2004  . Dementia ([place])   . Essential hypertension, benign   . Hyperthyroidism   . Mixed hyperlipidemia   . Peptic ulcer disease   . Shortness of breath   . TIA (transient ischemic attack)     Patient Active Problem List   Diagnosis Date Noted  . Generalized anxiety disorder 12/15/2014  . Precordial pain 06/23/2012  . Coronary atherosclerosis of native coronary artery 06/23/2012  . Tobacco abuse 06/23/2012  . Essential hypertension, benign 06/23/2012  . Mixed hyperlipidemia 06/23/2012     Past Surgical History:  Procedure Laterality Date  . ABDOMINAL HYSTERECTOMY    . CARPAL TUNNEL RELEASE Bilateral   . CATARACT EXTRACTION W/PHACO Left 11/16/2013   Procedure: CATARACT EXTRACTION PHACO AND INTRAOCULAR LENS PLACEMENT (IOC);  Surgeon: Tonny Branch, MD;  Location: [facility];  Service: Ophthalmology;  Laterality: Left;  CDE:  8.65  . CATARACT EXTRACTION W/PHACO Right 11/30/2013   Procedure: CATARACT EXTRACTION PHACO AND INTRAOCULAR LENS PLACEMENT (IOC);  Surgeon: Tonny Branch, MD;  Location: [facility];  Service: Ophthalmology;  Laterality: Right;  CDE:7.50  . HEMORRHOID SURGERY    . LUMBAR SPINE SURGERY    . Urethral transplant       OB History   No obstetric history on file.     Family History  Problem Relation Age of Onset  . Alcohol abuse Brother   . Depression Son   . Alcohol abuse Son   . Drug abuse Son   . Alcohol abuse Son   . Diabetes type II Other   . Hypertension Other   . Cancer Other   . Coronary artery disease Mother   . Dementia Mother   . Dementia Father   . Alcohol abuse Brother     Social History   Tobacco Use  . Smoking status: Former Smoker    Packs/day: 1.00    Years: 50.00    Pack years: 50.00    Types: Cigarettes  . Smokeless tobacco: Never Used  . Tobacco comment: Quit 10-08-2014 per Pt  Substance Use Topics  . Alcohol use: No  . Drug use: No    Home Medications Prior to Admission medications   Medication Sig Start Date End Date Taking? Authorizing Provider  ALPRAZolam Prudy Feeler) 0.5 MG tablet Take 1 tablet (0.5 mg total) by mouth 3 (three) times daily. Patient taking differently: Take 0.5 mg by mouth at bedtime.  12/15/14   Myrlene Broker, MD  aspirin EC 81 MG tablet Take 81 mg by mouth daily.    [provider]  Cholecalciferol (VITAMIN D) 2000 units CAPS Take 1 capsule by mouth daily.    [provider]  citalopram (CELEXA) 20 MG tablet Take 20 mg by mouth daily. 10/20/14   [provider]  Melatonin 3 MG TABS  Take 3 mg by mouth at bedtime.    [provider]  Memantine HCl-Donepezil HCl (NAMZARIC) 28-10 MG CP24 Take 1 capsule by mouth daily.    [provider]  simvastatin (ZOCOR) 40 MG tablet Take 40 mg by mouth daily. 11/23/14   [provider]  Vitamin D, Ergocalciferol, (DRISDOL) 50000 UNITS CAPS capsule Take 50,000 Units by mouth once a week. 11/15/14   [provider]  vitamin E 400 UNIT capsule Take 400 Units by mouth daily.    [provider]    Allergies    Patient has no known allergies.  Review of Systems   Review of Systems  Unable to perform ROS: Dementia    Physical Exam Updated Vital Signs BP 131/70 (BP Location: Right Arm)   Pulse (!) 101   Temp 98.7 F (37.1 C) (Oral)   Resp 16   Ht 5\' 3"  (1.6 m)   SpO2 98%   BMI 31.00 kg/m   Physical Exam Vitals and nursing note reviewed.  Constitutional:      General: She is not in acute distress.    Appearance: She is not ill-appearing.  HENT:     Head: Normocephalic. No raccoon eyes or Battle's sign.     Jaw: There is normal jaw occlusion.     Comments: No tenderness to palpation of skull. No deformities or crepitus noted. No open wounds, abrasions or lacerations.  Patient does have small hematoma to left occipital region.      Right Ear: Tympanic membrane and external ear normal. No hemotympanum.     Left Ear: Tympanic membrane and external ear normal. No hemotympanum.     Nose: Nose normal. No nasal tenderness.     Right Nostril: No epistaxis or septal hematoma.     Left Nostril: No epistaxis or septal hematoma.     Mouth/Throat:     Mouth: Mucous membranes are moist.     Pharynx: Oropharynx is clear.  Eyes:     General: No scleral icterus.       Right eye: No discharge.        Left eye: No discharge.     Extraocular Movements: Extraocular movements intact.     Conjunctiva/sclera: Conjunctivae normal.     Pupils: Pupils are equal, round, and reactive to light.  Neck:      Vascular: No JVD.     Comments: Full ROM intact without spinous process TTP. No bony stepoffs or deformities, no paraspinous muscle TTP or muscle spasms. No bruising, erythema, or swelling.  Cardiovascular:     Rate and Rhythm: Normal rate and regular rhythm.     Pulses: Normal pulses.          Radial pulses are 2+ on  the right side and 2+ on the left side.     Heart sounds: Normal heart sounds.  Pulmonary:     Comments: Lungs clear to auscultation in all fields. Symmetric chest rise. No wheezing, rales, or rhonchi. Abdominal:     Comments: Abdomen is soft, non-distended, and non-tender in all quadrants. No rigidity, no guarding. No peritoneal signs.  Musculoskeletal:        General: Normal range of motion.     Cervical back: Normal range of motion.     Comments: Palpated patient from head to toe without any apparent bony tenderness.  She is moving all extremities without signs of injury.  No obvious deformities. Pelvis is stable. Legs equal length  Skin:    General: Skin is warm and dry.     Capillary Refill: Capillary refill takes less than 2 seconds.  Neurological:     Mental Status: Mental status is at baseline.     GCS: GCS eye subscore is 4. GCS verbal subscore is 5. GCS motor subscore is 6.     Comments: no facial droop.  Per RN at nursing facility patient is at her baseline.  She opens her eyes spontaneously and is nonverbal.  Patient does not follow simple commands.  Psychiatric:        Behavior: Behavior normal.     ED Results / Procedures / Treatments   Labs (all labs ordered are listed, but only abnormal results are displayed) Labs Reviewed - No data to display  EKG None  Radiology CT Head Wo Contrast  Result Date: 11/02/2019 CLINICAL DATA:  69 year old female found down in room. Unwitnessed fall. Posterior head swelling. EXAM: CT HEAD WITHOUT CONTRAST TECHNIQUE: Contiguous axial images were obtained from the base of the skull through the vertex without  intravenous contrast. COMPARISON:  Head CT 10/23/2019. Report of [facility] brain MRI 03/26/2014 (no images available). FINDINGS: Brain: Stable cerebral volume. Normal ventricle size and configuration. No midline shift, mass effect, or evidence of intracranial mass lesion. Resolved small volume subarachnoid hemorrhage along the left sylvian fissure and convexity since earlier this month. No acute intracranial hemorrhage identified. Stable gray-white matter differentiation throughout the brain. No cortically based acute infarct identified. Vascular: Calcified atherosclerosis at the skull base. No suspicious intracranial vascular hyperdensity. Skull: Motion artifact at the skull base. Hyperostosis of the calvarium (normal variant). No acute osseous abnormality identified. Sinuses/Orbits: Visualized paranasal sinuses and mastoids are stable and well pneumatized. Other: No convincing acute scalp hematoma. Orbits soft tissues appear stable and negative. IMPRESSION: 1. No acute intracranial abnormality or acute traumatic injury identified. 2. Resolved small volume left side subarachnoid hemorrhage from 10 days ago. Electronically Signed   By: Odessa Fleming M.D.   On: 11/02/2019 15:52   CT Cervical Spine Wo Contrast  Result Date: 11/02/2019 CLINICAL DATA:  69 year old female found down in room. Unwitnessed fall. Posterior head swelling. EXAM: CT CERVICAL SPINE WITHOUT CONTRAST TECHNIQUE: Multidetector CT imaging of the cervical spine was performed without intravenous contrast. Multiplanar CT image reconstructions were also generated. COMPARISON:  Cervical spine CT 10/23/2019. FINDINGS: Alignment: Stable straightening of cervical lordosis. Cervicothoracic junction alignment is within normal limits. Posterior element alignment appears to remain normal. Skull base and vertebrae: Intermittent motion artifact today. Osteopenia. Visualized skull base is intact. No atlanto-occipital dissociation. No acute osseous  abnormality identified. Soft tissues and spinal canal: No prevertebral fluid or swelling. No visible canal hematoma. Negative noncontrast neck soft tissues aside from calcified carotid artery atherosclerosis. Disc levels: Mild for  age cervical spine degeneration better depicted on 10/23/2019. Upper chest: Visible upper thoracic levels appear intact. Negative lung apices. IMPRESSION: Intermittently degraded by motion artifact today but no acute traumatic injury identified in the cervical spine. Electronically Signed   By: Odessa Fleming M.D.   On: 11/02/2019 15:55    Procedures Procedures (including critical care time)  Medications Ordered in ED Medications - No data to display   ED Course  I have reviewed the triage vital signs and the nursing notes.  Pertinent labs & imaging results that were available during my care of the patient were reviewed by me and considered in my medical decision making (see chart for details). Vitals:   11/02/19 1545 11/02/19 1600 11/02/19 1630 11/02/19 1818  BP:  (!) 123/58 129/79 131/70  Pulse:   (!) 110 (!) 101  Resp: (!) 26 15 17 16   Temp:    98.7 F (37.1 C)  TempSrc:    Oral  SpO2:   99% 98%  Height:          MDM Rules/Calculators/A&P                     Patient seen and examined. Patient presents awake, hemodynamically stable, afebrile, non toxic. Patient is demented and unable to provide history. Contacted staff at [facility] who are able to give history report patient is at her baseline. She occasionally speaks, but it does not make sense. During my exam patient will mumble and speech is clear, but answers are inappropriate to questions asked. She does not follow simple commands which staff confirmed is her baseline. She is moving al extremities without signs of injury. She does not grimace or withdraw with palpation of cervical spine, no step offs or deformity, no obvious midline cervical pain. She has a small hematoma to left occipital scalp without open  wound or bleeding. She does not withdraw to pain when palpating her head. No obvious pain when palpating back, pelvis is stable, legs are equal length.   CT head without acute intracranial abnormality or acute traumatic injury. There is also resolved small volume left side subarachnoid hemorrhage compared to CT x10 days ago after mechanical fall. CT cervical spine without acute findings. Given there are no signs of significant injury on exam and CT imaging today is negative, patient is stable to discharge back to nursing facility.  The patient appears reasonably screened and/or stabilized for discharge and I doubt any other medical condition or other [facility] requiring further screening, evaluation, or treatment in the ED at this time prior to discharge. The patient is safe for discharge with strict return precautions outline in discharge paperwork. Recommend pcp follow up for symptom recheck. The patient was discussed with and seen by Dr. [facility] who agrees with the treatment plan.     Portions of this note were generated with Deretha Emory. Dictation errors may occur despite best attempts at proofreading.    Final Clinical Impression(s) / ED Diagnoses Final diagnoses:  Fall, initial encounter    Rx / DC Orders ED Discharge Orders    None       11-03-1969 11/02/19 2127    2128, PA-C 11/02/19 2128    2129, MD 11/03/19 1530

## 2019-11-02 NOTE — ED Notes (Signed)
Report called to [facility] at [phone].

## 2019-11-02 NOTE — ED Provider Notes (Signed)
Medical screening examination/treatment/procedure(s) were conducted as a shared visit with non-physician practitioner(s) and myself.  I personally evaluated the patient during the encounter.      Patient seen by me along with the physician assistant.  Patient from [facility].  Patient has dementia and mental status is baseline.  Staff found her on the floor in her room.  Unwitnessed fall.  Noted that she had swelling to the back of the head.  Patient baseline is nonverbal opens eyes and looking around room but not speaking patient pulls extremities up and arms up patient also had a fall a couple weeks ago and had a small subarachnoid hemorrhage.  That date was January 15.  Patient with some swelling to the back of the head no active bleeding.  CT head and neck without any acute findings.   Vanetta Mulders, MD 11/02/19 [phone]

## 2019-11-04 DIAGNOSIS — R278 Other lack of coordination: Secondary | ICD-10-CM | POA: Diagnosis not present

## 2019-11-04 DIAGNOSIS — M79641 Pain in right hand: Secondary | ICD-10-CM | POA: Diagnosis not present

## 2019-11-04 DIAGNOSIS — F0390 Unspecified dementia without behavioral disturbance: Secondary | ICD-10-CM | POA: Diagnosis not present

## 2019-11-09 DIAGNOSIS — Z23 Encounter for immunization: Secondary | ICD-10-CM | POA: Diagnosis not present

## 2019-11-11 DIAGNOSIS — F0390 Unspecified dementia without behavioral disturbance: Secondary | ICD-10-CM | POA: Diagnosis not present

## 2019-11-11 DIAGNOSIS — M79641 Pain in right hand: Secondary | ICD-10-CM | POA: Diagnosis not present

## 2019-11-11 DIAGNOSIS — R278 Other lack of coordination: Secondary | ICD-10-CM | POA: Diagnosis not present

## 2019-11-19 DIAGNOSIS — R278 Other lack of coordination: Secondary | ICD-10-CM | POA: Diagnosis not present

## 2019-11-19 DIAGNOSIS — M79641 Pain in right hand: Secondary | ICD-10-CM | POA: Diagnosis not present

## 2019-11-19 DIAGNOSIS — F0390 Unspecified dementia without behavioral disturbance: Secondary | ICD-10-CM | POA: Diagnosis not present

## 2019-11-25 DIAGNOSIS — M79674 Pain in right toe(s): Secondary | ICD-10-CM | POA: Diagnosis not present

## 2019-11-25 DIAGNOSIS — M79675 Pain in left toe(s): Secondary | ICD-10-CM | POA: Diagnosis not present

## 2019-11-25 DIAGNOSIS — B351 Tinea unguium: Secondary | ICD-10-CM | POA: Diagnosis not present

## 2019-11-27 DIAGNOSIS — G301 Alzheimer's disease with late onset: Secondary | ICD-10-CM | POA: Diagnosis not present

## 2019-11-27 DIAGNOSIS — E785 Hyperlipidemia, unspecified: Secondary | ICD-10-CM | POA: Diagnosis not present

## 2019-12-07 DIAGNOSIS — Z23 Encounter for immunization: Secondary | ICD-10-CM | POA: Diagnosis not present

## 2019-12-17 DIAGNOSIS — E785 Hyperlipidemia, unspecified: Secondary | ICD-10-CM | POA: Diagnosis not present

## 2019-12-17 DIAGNOSIS — G301 Alzheimer's disease with late onset: Secondary | ICD-10-CM | POA: Diagnosis not present

## 2019-12-17 DIAGNOSIS — F339 Major depressive disorder, recurrent, unspecified: Secondary | ICD-10-CM | POA: Diagnosis not present

## 2020-01-05 DIAGNOSIS — U071 COVID-19: Secondary | ICD-10-CM | POA: Diagnosis not present

## 2020-01-05 DIAGNOSIS — Z20828 Contact with and (suspected) exposure to other viral communicable diseases: Secondary | ICD-10-CM | POA: Diagnosis not present

## 2020-01-06 ENCOUNTER — Encounter (HOSPITAL_COMMUNITY): Payer: Self-pay | Admitting: *Deleted

## 2020-01-06 ENCOUNTER — Other Ambulatory Visit: Payer: Self-pay

## 2020-01-06 ENCOUNTER — Emergency Department (HOSPITAL_COMMUNITY): Payer: Medicare Other

## 2020-01-06 ENCOUNTER — Inpatient Hospital Stay (HOSPITAL_COMMUNITY)
Admission: EM | Admit: 2020-01-06 | Discharge: 2020-01-09 | DRG: 689 | Disposition: A | Discharge status: Skilled Nursing Facility | Payer: Medicare Other | Source: Skilled Nursing Facility | Attending: Family Medicine | Admitting: Family Medicine

## 2020-01-06 ENCOUNTER — Inpatient Hospital Stay (HOSPITAL_COMMUNITY): Payer: Medicare Other

## 2020-01-06 DIAGNOSIS — G9341 Metabolic encephalopathy: Secondary | ICD-10-CM | POA: Diagnosis present

## 2020-01-06 DIAGNOSIS — N390 Urinary tract infection, site not specified: Principal | ICD-10-CM | POA: Diagnosis present

## 2020-01-06 DIAGNOSIS — Z8673 Personal history of transient ischemic attack (TIA), and cerebral infarction without residual deficits: Secondary | ICD-10-CM

## 2020-01-06 DIAGNOSIS — K279 Peptic ulcer, site unspecified, unspecified as acute or chronic, without hemorrhage or perforation: Secondary | ICD-10-CM | POA: Diagnosis not present

## 2020-01-06 DIAGNOSIS — R0902 Hypoxemia: Secondary | ICD-10-CM | POA: Diagnosis not present

## 2020-01-06 DIAGNOSIS — Z90710 Acquired absence of both cervix and uterus: Secondary | ICD-10-CM | POA: Diagnosis not present

## 2020-01-06 DIAGNOSIS — I10 Essential (primary) hypertension: Secondary | ICD-10-CM | POA: Diagnosis present

## 2020-01-06 DIAGNOSIS — E782 Mixed hyperlipidemia: Secondary | ICD-10-CM | POA: Diagnosis present

## 2020-01-06 DIAGNOSIS — Z87891 Personal history of nicotine dependence: Secondary | ICD-10-CM

## 2020-01-06 DIAGNOSIS — Z20822 Contact with and (suspected) exposure to covid-19: Secondary | ICD-10-CM | POA: Diagnosis present

## 2020-01-06 DIAGNOSIS — R05 Cough: Secondary | ICD-10-CM | POA: Diagnosis not present

## 2020-01-06 DIAGNOSIS — G9340 Encephalopathy, unspecified: Secondary | ICD-10-CM

## 2020-01-06 DIAGNOSIS — Z03818 Encounter for observation for suspected exposure to other biological agents ruled out: Secondary | ICD-10-CM | POA: Diagnosis not present

## 2020-01-06 DIAGNOSIS — I2510 Atherosclerotic heart disease of native coronary artery without angina pectoris: Secondary | ICD-10-CM | POA: Diagnosis present

## 2020-01-06 DIAGNOSIS — R52 Pain, unspecified: Secondary | ICD-10-CM | POA: Diagnosis not present

## 2020-01-06 DIAGNOSIS — R6889 Other general symptoms and signs: Secondary | ICD-10-CM | POA: Diagnosis not present

## 2020-01-06 DIAGNOSIS — Z7401 Bed confinement status: Secondary | ICD-10-CM | POA: Diagnosis not present

## 2020-01-06 DIAGNOSIS — J449 Chronic obstructive pulmonary disease, unspecified: Secondary | ICD-10-CM | POA: Diagnosis present

## 2020-01-06 DIAGNOSIS — E860 Dehydration: Secondary | ICD-10-CM | POA: Diagnosis present

## 2020-01-06 DIAGNOSIS — I959 Hypotension, unspecified: Secondary | ICD-10-CM | POA: Diagnosis not present

## 2020-01-06 DIAGNOSIS — F03C0 Unspecified dementia, severe, without behavioral disturbance, psychotic disturbance, mood disturbance, and anxiety: Secondary | ICD-10-CM | POA: Diagnosis present

## 2020-01-06 DIAGNOSIS — Z7982 Long term (current) use of aspirin: Secondary | ICD-10-CM | POA: Diagnosis not present

## 2020-01-06 DIAGNOSIS — L89616 Pressure-induced deep tissue damage of right heel: Secondary | ICD-10-CM | POA: Diagnosis not present

## 2020-01-06 DIAGNOSIS — R4701 Aphasia: Secondary | ICD-10-CM | POA: Diagnosis present

## 2020-01-06 DIAGNOSIS — R404 Transient alteration of awareness: Secondary | ICD-10-CM | POA: Diagnosis not present

## 2020-01-06 DIAGNOSIS — Z79899 Other long term (current) drug therapy: Secondary | ICD-10-CM

## 2020-01-06 DIAGNOSIS — F329 Major depressive disorder, single episode, unspecified: Secondary | ICD-10-CM | POA: Diagnosis not present

## 2020-01-06 DIAGNOSIS — Z741 Need for assistance with personal care: Secondary | ICD-10-CM | POA: Diagnosis not present

## 2020-01-06 DIAGNOSIS — M6281 Muscle weakness (generalized): Secondary | ICD-10-CM | POA: Diagnosis not present

## 2020-01-06 DIAGNOSIS — N059 Unspecified nephritic syndrome with unspecified morphologic changes: Secondary | ICD-10-CM | POA: Diagnosis present

## 2020-01-06 DIAGNOSIS — R29818 Other symptoms and signs involving the nervous system: Secondary | ICD-10-CM | POA: Diagnosis not present

## 2020-01-06 DIAGNOSIS — Z833 Family history of diabetes mellitus: Secondary | ICD-10-CM | POA: Diagnosis not present

## 2020-01-06 DIAGNOSIS — F0390 Unspecified dementia without behavioral disturbance: Secondary | ICD-10-CM | POA: Diagnosis present

## 2020-01-06 DIAGNOSIS — R32 Unspecified urinary incontinence: Secondary | ICD-10-CM | POA: Diagnosis present

## 2020-01-06 DIAGNOSIS — F411 Generalized anxiety disorder: Secondary | ICD-10-CM | POA: Diagnosis present

## 2020-01-06 DIAGNOSIS — R5381 Other malaise: Secondary | ICD-10-CM | POA: Diagnosis not present

## 2020-01-06 DIAGNOSIS — L89626 Pressure-induced deep tissue damage of left heel: Secondary | ICD-10-CM | POA: Diagnosis not present

## 2020-01-06 DIAGNOSIS — R1312 Dysphagia, oropharyngeal phase: Secondary | ICD-10-CM | POA: Diagnosis not present

## 2020-01-06 DIAGNOSIS — R4182 Altered mental status, unspecified: Secondary | ICD-10-CM | POA: Diagnosis present

## 2020-01-06 DIAGNOSIS — E785 Hyperlipidemia, unspecified: Secondary | ICD-10-CM | POA: Diagnosis not present

## 2020-01-06 DIAGNOSIS — F339 Major depressive disorder, recurrent, unspecified: Secondary | ICD-10-CM | POA: Diagnosis not present

## 2020-01-06 DIAGNOSIS — G459 Transient cerebral ischemic attack, unspecified: Secondary | ICD-10-CM | POA: Diagnosis not present

## 2020-01-06 DIAGNOSIS — R4020 Unspecified coma: Secondary | ICD-10-CM | POA: Diagnosis not present

## 2020-01-06 LAB — CBC WITH DIFFERENTIAL/PLATELET
Abs Immature Granulocytes: 0.02 10*3/uL (ref 0.00–0.07)
Basophils Absolute: 0 10*3/uL (ref 0.0–0.1)
Basophils Relative: 0 %
Eosinophils Absolute: 0 10*3/uL (ref 0.0–0.5)
Eosinophils Relative: 0 %
HCT: 38.5 % (ref 36.0–46.0)
Hemoglobin: 12.9 g/dL (ref 12.0–15.0)
Immature Granulocytes: 0 %
Lymphocytes Relative: 10 %
Lymphs Abs: 0.7 10*3/uL (ref 0.7–4.0)
MCH: 35 pg — ABNORMAL HIGH (ref 26.0–34.0)
MCHC: 33.5 g/dL (ref 30.0–36.0)
MCV: 104.3 fL — ABNORMAL HIGH (ref 80.0–100.0)
Monocytes Absolute: 0.3 10*3/uL (ref 0.1–1.0)
Monocytes Relative: 5 %
Neutro Abs: 6.1 10*3/uL (ref 1.7–7.7)
Neutrophils Relative %: 85 %
Platelets: 175 10*3/uL (ref 150–400)
RBC: 3.69 MIL/uL — ABNORMAL LOW (ref 3.87–5.11)
RDW: 12.2 % (ref 11.5–15.5)
WBC: 7.2 10*3/uL (ref 4.0–10.5)
nRBC: 0 % (ref 0.0–0.2)

## 2020-01-06 LAB — COMPREHENSIVE METABOLIC PANEL
ALT: 17 U/L (ref 0–44)
AST: 27 U/L (ref 15–41)
Albumin: 3.7 g/dL (ref 3.5–5.0)
Alkaline Phosphatase: 77 U/L (ref 38–126)
Anion gap: 8 (ref 5–15)
BUN: 19 mg/dL (ref 8–23)
CO2: 26 mmol/L (ref 22–32)
Calcium: 9.8 mg/dL (ref 8.9–10.3)
Chloride: 105 mmol/L (ref 98–111)
Creatinine, Ser: 0.79 mg/dL (ref 0.44–1.00)
GFR calc Af Amer: >60 mL/min (ref >60)
GFR calc non Af Amer: >60 mL/min (ref >60)
Glucose, Bld: 101 mg/dL — ABNORMAL HIGH (ref 70–99)
Potassium: 4.2 mmol/L (ref 3.5–5.1)
Sodium: 139 mmol/L (ref 135–145)
Total Bilirubin: 0.4 mg/dL (ref 0.3–1.2)
Total Protein: 6.8 g/dL (ref 6.5–8.1)

## 2020-01-06 LAB — MRSA PCR SCREENING: MRSA by PCR: NEGATIVE

## 2020-01-06 LAB — URINALYSIS, ROUTINE W REFLEX MICROSCOPIC
Bilirubin Urine: NEGATIVE
Glucose, UA: NEGATIVE mg/dL
Ketones, ur: NEGATIVE mg/dL
Nitrite: NEGATIVE
Protein, ur: NEGATIVE mg/dL
Specific Gravity, Urine: 1.017 (ref 1.005–1.030)
WBC, UA: >50 WBC/hpf — ABNORMAL HIGH (ref 0–5)
pH: 6 (ref 5.0–8.0)

## 2020-01-06 LAB — CBG MONITORING, ED: Glucose-Capillary: 106 mg/dL — ABNORMAL HIGH (ref 70–99)

## 2020-01-06 LAB — AMMONIA: Ammonia: 20 umol/L (ref 9–35)

## 2020-01-06 LAB — LACTIC ACID, PLASMA: Lactic Acid, Venous: 1.7 mmol/L (ref 0.5–1.9)

## 2020-01-06 LAB — SARS CORONAVIRUS 2 (TAT 6-24 HRS): SARS Coronavirus 2: NEGATIVE

## 2020-01-06 MED ORDER — LORAZEPAM 2 MG/ML IJ SOLN: 0.5000 mg | INTRAMUSCULAR | Status: DC | PRN

## 2020-01-06 MED ORDER — ENSURE ENLIVE PO LIQD
237.0000 mL | Freq: Two times a day (BID) | ORAL | Status: DC
  Administered 2020-01-07 – 2020-01-09 (×4): 237 mL via ORAL

## 2020-01-06 MED ORDER — ACETAMINOPHEN 325 MG PO TABS
650.0000 mg | ORAL_TABLET | Freq: Four times a day (QID) | ORAL | Status: DC | PRN
  Administered 2020-01-09: 650 mg via ORAL
  Filled 2020-01-06: qty 2

## 2020-01-06 MED ORDER — ONDANSETRON HCL 4 MG PO TABS: 4.0000 mg | ORAL_TABLET | Freq: Four times a day (QID) | ORAL | Status: DC | PRN

## 2020-01-06 MED ORDER — SODIUM CHLORIDE 0.9 % IV SOLN
1.0000 g | INTRAVENOUS | Status: AC
  Administered 2020-01-07 – 2020-01-09 (×3): 1 g via INTRAVENOUS
  Filled 2020-01-06 (×3): qty 10

## 2020-01-06 MED ORDER — SODIUM CHLORIDE 0.9 % IV SOLN
1.0000 g | Freq: Once | INTRAVENOUS | Status: AC
  Administered 2020-01-06: 1 g via INTRAVENOUS
  Filled 2020-01-06: qty 10

## 2020-01-06 MED ORDER — SODIUM CHLORIDE 0.9 % IV SOLN: INTRAVENOUS | Status: DC

## 2020-01-06 MED ORDER — ACETAMINOPHEN 650 MG RE SUPP: 650.0000 mg | Freq: Four times a day (QID) | RECTAL | Status: DC | PRN

## 2020-01-06 MED ORDER — BISACODYL 5 MG PO TBEC: 5.0000 mg | DELAYED_RELEASE_TABLET | Freq: Every day | ORAL | Status: DC | PRN

## 2020-01-06 MED ORDER — ONDANSETRON HCL 4 MG/2ML IJ SOLN: 4.0000 mg | Freq: Four times a day (QID) | INTRAMUSCULAR | Status: DC | PRN

## 2020-01-06 MED ORDER — ENOXAPARIN SODIUM 40 MG/0.4ML ~~LOC~~ SOLN
40.0000 mg | SUBCUTANEOUS | Status: DC
  Administered 2020-01-06 – 2020-01-08 (×3): 40 mg via SUBCUTANEOUS
  Filled 2020-01-06 (×3): qty 0.4

## 2020-01-06 NOTE — ED Triage Notes (Signed)
Pt brought in by [facility] from [facility] nursing home with c/o possible stroke d/t pt drooling and leaning to the left. Pt was clenched all over and had a congested cough per EMS. Nursing staff at facility denied any recent illness, n/v/d. [facility] 128 for EMS. Unable to obtain BP per EMS. Pt has dementia. EMS reports pt was negative on stroke scale.

## 2020-01-06 NOTE — ED Provider Notes (Signed)
[facility] EMERGENCY DEPARTMENT Provider Note   CSN: [id number] Arrival date & time: 01/06/20  0740     History Chief Complaint  Patient presents with  . Altered Mental Status    Susan Fowler is a 69 y.o. female.  Level 5 caveat secondary to dementia.  At baseline she is nonverbal nonambulatory.  Per EMS she was normal last evening when she went to bed.  This morning staff found her hunched over in bed, wouldn't open eyes to voice, which is different. Usually has stiffness in arms and legs. Mumbles at baseline, not conversational. Nonambulatory. Eyes clenched closed.  No reported vomiting or diarrhea.  The history is provided by the EMS personnel and a caregiver. The history is limited by the condition of the patient.  Altered Mental Status Presenting symptoms: partial responsiveness   Severity:  Unable to specify Most recent episode:  Today Episode history:  Single Duration:  1 hour Timing:  Constant Progression:  Unchanged Chronicity:  New Context: dementia and nursing home resident   Associated symptoms: bladder incontinence (baseline)   Associated symptoms: no fever and no vomiting        Past Medical History:  Diagnosis Date  . Bright's disease    Reportedly urethral transplant, single functioning kidney  . COPD (chronic obstructive pulmonary disease) (HCC)   . Coronary atherosclerosis of native coronary artery    Nonobstructive at catheterization 2004  . Dementia (HCC)   . Essential hypertension, benign   . Hyperthyroidism   . Mixed hyperlipidemia   . Peptic ulcer disease   . Shortness of breath   . TIA (transient ischemic attack)     Patient Active Problem List   Diagnosis Date Noted  . Generalized anxiety disorder 12/15/2014  . Precordial pain 06/23/2012  . Coronary atherosclerosis of native coronary artery 06/23/2012  . Tobacco abuse 06/23/2012  . Essential hypertension, benign 06/23/2012  . Mixed hyperlipidemia 06/23/2012    Past Surgical  History:  Procedure Laterality Date  . ABDOMINAL HYSTERECTOMY    . CARPAL TUNNEL RELEASE Bilateral   . CATARACT EXTRACTION W/PHACO Left 11/16/2013   Procedure: CATARACT EXTRACTION PHACO AND INTRAOCULAR LENS PLACEMENT (IOC);  Surgeon: Gemma Payor, MD;  Location: [facility];  Service: Ophthalmology;  Laterality: Left;  CDE:  8.65  . CATARACT EXTRACTION W/PHACO Right 11/30/2013   Procedure: CATARACT EXTRACTION PHACO AND INTRAOCULAR LENS PLACEMENT (IOC);  Surgeon: Gemma Payor, MD;  Location: [facility];  Service: Ophthalmology;  Laterality: Right;  CDE:7.50  . HEMORRHOID SURGERY    . LUMBAR SPINE SURGERY    . Urethral transplant       OB History   No obstetric history on file.     Family History  Problem Relation Age of Onset  . Alcohol abuse Brother   . Depression Son   . Alcohol abuse Son   . Drug abuse Son   . Alcohol abuse Son   . Diabetes type II Other   . Hypertension Other   . Cancer Other   . Coronary artery disease Mother   . Dementia Mother   . Dementia Father   . Alcohol abuse Brother     Social History   Tobacco Use  . Smoking status: Former Smoker    Packs/day: 1.00    Years: 50.00    Pack years: 50.00    Types: Cigarettes  . Smokeless tobacco: Never Used  . Tobacco comment: Quit 10-08-2014 per Pt  Substance Use Topics  . Alcohol use: No  . Drug  use: No    Home Medications Prior to Admission medications   Medication Sig Start Date End Date Taking? Authorizing Provider  ALPRAZolam Prudy Feeler) 0.5 MG tablet Take 1 tablet (0.5 mg total) by mouth 3 (three) times daily. Patient taking differently: Take 0.5 mg by mouth at bedtime.  12/15/14   Myrlene Broker, MD  aspirin EC 81 MG tablet Take 81 mg by mouth daily.    [provider]  Cholecalciferol (VITAMIN D) 2000 units CAPS Take 1 capsule by mouth daily.    [provider]  citalopram (CELEXA) 20 MG tablet Take 20 mg by mouth daily. 10/20/14   [provider]  Melatonin 3 MG TABS Take 3 mg by mouth  at bedtime.    [provider]  Memantine HCl-Donepezil HCl (NAMZARIC) 28-10 MG CP24 Take 1 capsule by mouth daily.    [provider]  simvastatin (ZOCOR) 40 MG tablet Take 40 mg by mouth daily. 11/23/14   [provider]  Vitamin D, Ergocalciferol, (DRISDOL) 50000 UNITS CAPS capsule Take 50,000 Units by mouth once a week. 11/15/14   [provider]  vitamin E 400 UNIT capsule Take 400 Units by mouth daily.    [provider]    Allergies    Patient has no known allergies.  Review of Systems   Review of Systems  Unable to perform ROS: Dementia  Constitutional: Negative for fever.  Gastrointestinal: Negative for vomiting.  Genitourinary: Positive for bladder incontinence (baseline).    Physical Exam Updated Vital Signs BP 115/71 (BP Location: Left Arm)   Pulse 90   Temp 98 F (36.7 C) (Rectal)   Resp 14   Ht 5\' 3"  (1.6 m)   Wt 79.4 kg   SpO2 92%   BMI 31.01 kg/m   Physical Exam Vitals and nursing note reviewed.  Constitutional:      General: She is not in acute distress.    Appearance: She is well-developed.  HENT:     Head: Normocephalic and atraumatic.  Eyes:     Conjunctiva/sclera: Conjunctivae normal.  Cardiovascular:     Rate and Rhythm: Normal rate and regular rhythm.     Heart sounds: No murmur.  Pulmonary:     Effort: Pulmonary effort is normal. No respiratory distress.     Breath sounds: Normal breath sounds.  Abdominal:     Palpations: Abdomen is soft.     Tenderness: There is no abdominal tenderness. There is no guarding or rebound.  Musculoskeletal:        General: No deformity or signs of injury.     Cervical back: Neck supple.  Skin:    General: Skin is warm and dry.     Capillary Refill: Capillary refill takes less than 2 seconds.  Neurological:     Comments: Patient is not following commands.  She is holding her extremities clenched against her chest and it is difficult to straighten them out.  She  resists any movement in her legs also.  There does not seem to be any asymmetry to her strength.     ED Results / Procedures / Treatments   Labs (all labs ordered are listed, but only abnormal results are displayed) Labs Reviewed  CBC WITH DIFFERENTIAL/PLATELET - Abnormal; Notable for the following components:      Result Value   RBC 3.69 (*)    MCV 104.3 (*)    MCH 35.0 (*)    All other components within normal limits  COMPREHENSIVE METABOLIC PANEL -  Abnormal; Notable for the following components:   Glucose, Bld 101 (*)    All other components within normal limits  URINALYSIS, ROUTINE W REFLEX MICROSCOPIC - Abnormal; Notable for the following components:   APPearance CLOUDY (*)    Hgb urine dipstick SMALL (*)    Leukocytes,Ua LARGE (*)    WBC, UA >50 (*)    Bacteria, UA RARE (*)    All other components within normal limits  CBG MONITORING, ED - Abnormal; Notable for the following components:   Glucose-Capillary 106 (*)    All other components within normal limits  CULTURE, BLOOD (ROUTINE X 2)  CULTURE, BLOOD (ROUTINE X 2)  URINE CULTURE  SARS CORONAVIRUS 2 (TAT 6-24 HRS)  MRSA PCR SCREENING  AMMONIA  LACTIC ACID, PLASMA  HIV ANTIBODY (ROUTINE TESTING W REFLEX)  CBC WITH DIFFERENTIAL/PLATELET  COMPREHENSIVE METABOLIC PANEL  MAGNESIUM    EKG EKG Interpretation  Date/Time:  Wednesday January 06 2020 08:06:53 EDT Ventricular Rate:  80 PR Interval:    QRS Duration: 92 QT Interval:  387 QTC Calculation: 447 R Axis:   67 Text Interpretation: Normal sinus rhythm Artifact in lead(s) I III aVR aVL V1 V2 V3 improved ischemic changes from prior 2/15 Confirmed by Meridee ScoreButler, Eulamae Greenstein [phone]([id number]) on 01/06/2020 8:12:03 AM   Radiology CT Head Wo Contrast  Result Date: 01/06/2020 CLINICAL DATA:  Altered mental status. EXAM: CT HEAD WITHOUT CONTRAST TECHNIQUE: Contiguous axial images were obtained from the base of the skull through the vertex without intravenous contrast. COMPARISON:   11/02/2019 FINDINGS: Brain: There is no evidence for acute hemorrhage, hydrocephalus, mass lesion, or abnormal extra-axial fluid collection. No definite CT evidence for acute infarction. Diffuse loss of parenchymal volume is consistent with atrophy. Patchy low attenuation in the deep hemispheric and periventricular white matter is nonspecific, but likely reflects chronic microvascular ischemic demyelination. Vascular: No hyperdense vessel or unexpected calcification. Skull: No evidence for fracture. No worrisome lytic or sclerotic lesion. Sinuses/Orbits: The visualized paranasal sinuses and mastoid air cells are clear. Visualized portions of the globes and intraorbital fat are unremarkable. Other: None. IMPRESSION: Stable.  No acute intracranial abnormality. Atrophy with chronic small vessel white matter ischemic disease. Electronically Signed   By: Kennith CenterEric  Mansell M.D.   On: 01/06/2020 09:34   MR BRAIN WO CONTRAST  Result Date: 01/06/2020 CLINICAL DATA:  Neuro deficit.  Rule out stroke EXAM: MRI HEAD WITHOUT CONTRAST TECHNIQUE: Multiplanar, multiecho pulse sequences of the brain and surrounding structures were obtained without intravenous contrast. COMPARISON:  CT head 01/06/2020 FINDINGS: Brain: Motion degraded study Negative for acute infarct. Mild chronic microvascular ischemic changes in the white matter. Mild atrophy without hydrocephalus. Negative for hemorrhage or mass Vascular: Normal arterial flow voids. Skull and upper cervical spine: Negative Sinuses/Orbits: Paranasal sinuses clear. Bilateral cataract extraction Other: None IMPRESSION: Motion degraded study No acute abnormality. Electronically Signed   By: Marlan Palauharles  Clark M.D.   On: 01/06/2020 16:50   DG Chest Port 1 View  Result Date: 01/06/2020 CLINICAL DATA:  Cough. EXAM: PORTABLE CHEST 1 VIEW COMPARISON:  09/23/2019 FINDINGS: Normal heart size. No pleural effusion or edema. Decreased lung volumes. No airspace opacities. IMPRESSION: 1. No acute  cardiopulmonary abnormalities. Electronically Signed   By: Signa Kellaylor  Stroud M.D.   On: 01/06/2020 08:36    Procedures .Critical Care Performed by: Terrilee FilesButler, Edris Friedt C, MD Authorized by: Terrilee FilesButler, Lawsen Arnott C, MD   Critical care provider statement:    Critical care time (minutes):  45   Critical care time was exclusive of:  Separately billable procedures and treating other patients   Critical care was necessary to treat or prevent imminent or life-threatening deterioration of the following conditions:  CNS failure or compromise   Critical care was time spent personally by me on the following activities:  Discussions with consultants, evaluation of patient's response to treatment, examination of patient, ordering and performing treatments and interventions, ordering and review of laboratory studies, ordering and review of radiographic studies, pulse oximetry, re-evaluation of patient's condition, obtaining history from patient or surrogate, review of old charts and development of treatment plan with patient or surrogate   I assumed direction of critical care for this patient from another provider in my specialty: no     (including critical care time)  Medications Ordered in ED Medications  cefTRIAXone (ROCEPHIN) 1 g in sodium chloride 0.9 % 100 mL IVPB (has no administration in time range)  enoxaparin (LOVENOX) injection 40 mg (has no administration in time range)  0.9 %  sodium chloride infusion ( Intravenous New Bag/Given 01/06/20 1524)  acetaminophen (TYLENOL) tablet 650 mg (has no administration in time range)    Or  acetaminophen (TYLENOL) suppository 650 mg (has no administration in time range)  ondansetron (ZOFRAN) tablet 4 mg (has no administration in time range)    Or  ondansetron (ZOFRAN) injection 4 mg (has no administration in time range)  bisacodyl (DULCOLAX) EC tablet 5 mg (has no administration in time range)  LORazepam (ATIVAN) injection 0.5 mg (has no administration in time range)    feeding supplement (ENSURE ENLIVE) (ENSURE ENLIVE) liquid 237 mL (237 mLs Oral Not Given 01/06/20 1512)  cefTRIAXone (ROCEPHIN) 1 g in sodium chloride 0.9 % 100 mL IVPB (0 g Intravenous Stopped 01/06/20 1205)    ED Course  I have reviewed the triage vital signs and the nursing notes.  Pertinent labs & imaging results that were available during my care of the patient were reviewed by me and considered in my medical decision making (see chart for details).  Clinical Course as of Jan 06 1747  Wed Jan 06, 2020  0801 I called [facility].  I talked to one of the med techs as there was no nurse on staff.  Is at her baseline is nonverbal nonambulatory, needs assistance with feeding.  She will acknowledge voice and sometimes smile or mumble.  Has known rigidity in her upper and lower extremities.  They do not know her CODE STATUS.   [MB]  0802 Differential includes infection such as pneumonia or UTI.  Also consider metabolic derangements dehydration.  History of subarachnoid from fall will recheck head CT to evaluate for bleed stroke.   [MB]  [phone] Lab work coming back fairly unremarkable with a normal white count normal chemistries normal LFTs normal ammonia normal lactate   [MB]  1053 Discussed with Dr. Laural Benes from [facility] who will evaluate the patient for admission.   [MB]  1054  Urinalysis positive greater than 50 whites and 21-50 reds on catheter urine.  Ordered ceftriaxone.  Urine culture pending.  This is probably source of her change in mental status.   [MB]    Clinical Course User Index [MB] Terrilee Files, MD   MDM Rules/Calculators/A&P                       Final Clinical Impression(s) / ED Diagnoses Final diagnoses:  Acute encephalopathy  Urinary tract infection without hematuria, site unspecified    Rx / DC Orders ED Discharge  Orders    None       Hayden Rasmussen, MD 01/06/20 [phone]

## 2020-01-06 NOTE — H&P (Addendum)
History and Physical  [place] FAO:[id number] DOB: [birth year] DOA: 01/06/2020  PCP: Rosita Fire, MD  Patient coming from: [facility]  I have personally briefly reviewed patient's old medical records in [place]  Chief Complaint: Altered mental state, concern about CVA  HPI: Susan Fowler is a 69 y.o. female with medical history significant of severe dementia with minimal baseline function, permanent resident [facility] was brought in by EMS when they noticed that the patient was drooling and leaning to the left and they were concerned about a possible stroke. The patient had chest congestion and was clinching her fists. She is nonverbal at baseline and requires full assistance for all ADLs including eating. The patient was noted to have a CBG of 128 reported by EMS.  ED Course: Patient arrived with a 98.0 rectal temperature, blood pressure 115/71, pulse ox 96% on room air, weight 79.4 kg, poorly responsive, clenched fists, CT head with no acute obvious injury or CVA, CMP and CBC stable and unremarkable urinalysis with large leukocytes and greater than 50 WBC per high-power field, chest x-ray no acute abnormalities. Patient was started on IV ceftriaxone and admission was requested.  Review of Systems: Unable to obtain due to severe dementia at baseline.   Past Medical History:  Diagnosis Date  . Bright's disease    Reportedly urethral transplant, single functioning kidney  . COPD (chronic obstructive pulmonary disease) ([place])   . Coronary atherosclerosis of native coronary artery    Nonobstructive at catheterization 2004  . Dementia ([place])   . Essential hypertension, benign   . Hyperthyroidism   . Mixed hyperlipidemia   . Peptic ulcer disease   . Shortness of breath   . TIA (transient ischemic attack)     Past Surgical History:  Procedure Laterality Date  . ABDOMINAL HYSTERECTOMY    . CARPAL TUNNEL RELEASE Bilateral   . CATARACT  EXTRACTION W/PHACO Left 11/16/2013   Procedure: CATARACT EXTRACTION PHACO AND INTRAOCULAR LENS PLACEMENT (IOC);  Surgeon: Tonny Branch, MD;  Location: [facility];  Service: Ophthalmology;  Laterality: Left;  CDE:  8.65  . CATARACT EXTRACTION W/PHACO Right 11/30/2013   Procedure: CATARACT EXTRACTION PHACO AND INTRAOCULAR LENS PLACEMENT (IOC);  Surgeon: Tonny Branch, MD;  Location: [facility];  Service: Ophthalmology;  Laterality: Right;  CDE:7.50  . HEMORRHOID SURGERY    . LUMBAR SPINE SURGERY    . Urethral transplant       reports that she has quit smoking. Her smoking use included cigarettes. She has a 50.00 pack-year smoking history. She has never used smokeless tobacco. She reports that she does not drink alcohol or use drugs.  No Known Allergies  Family History  Problem Relation Age of Onset  . Alcohol abuse Brother   . Depression Son   . Alcohol abuse Son   . Drug abuse Son   . Alcohol abuse Son   . Diabetes type II Other   . Hypertension Other   . Cancer Other   . Coronary artery disease Mother   . Dementia Mother   . Dementia Father   . Alcohol abuse Brother      Prior to Admission medications   Medication Sig Start Date End Date Taking? Authorizing Provider  ALPRAZolam Duanne Moron) 0.5 MG tablet Take 1 tablet (0.5 mg total) by mouth 3 (three) times daily. Patient taking differently: Take 0.5 mg by mouth at bedtime.  12/15/14   Cloria Spring, MD  aspirin EC 81  MG tablet Take 81 mg by mouth daily.    [provider]  Cholecalciferol (VITAMIN D) 2000 units CAPS Take 1 capsule by mouth daily.    [provider]  citalopram (CELEXA) 20 MG tablet Take 20 mg by mouth daily. 10/20/14   [provider]  Melatonin 3 MG TABS Take 3 mg by mouth at bedtime.    [provider]  Memantine HCl-Donepezil HCl (NAMZARIC) 28-10 MG CP24 Take 1 capsule by mouth daily.    [provider]  simvastatin (ZOCOR) 40 MG tablet Take 40 mg by mouth daily. 11/23/14   [provider]  Vitamin D, Ergocalciferol, (DRISDOL) 50000 UNITS CAPS capsule Take 50,000 Units by mouth once a week. 11/15/14   [provider]  vitamin E 400 UNIT capsule Take 400 Units by mouth daily.    [provider]    Physical Exam: Vitals:   01/06/20 0744 01/06/20 0751 01/06/20 1102 01/06/20 1204  BP:  115/71 115/68 (!) 126/56  Pulse:  90 96 86  Resp:  14 14 10   Temp:  98 F (36.7 C)    TempSrc:  Rectal    SpO2:  96% 100% 100%  Weight: 79.4 kg     Height: 5\' 3"  (1.6 m)      Constitutional: Patient with severe dementia, nonverbal, eyes closed but arousable, does not follow commands, NAD, calm, comfortable, clenching both fists tightly. Eyes: PERRL, lids and conjunctivae normal ENMT: Mucous membranes are dry. Posterior pharynx clear of any exudate or lesions.  Neck: normal, supple, no masses, no thyromegaly Respiratory: clear to auscultation bilaterally, no wheezing, no crackles. Normal respiratory effort. No accessory muscle use.  Cardiovascular: Regular rate and rhythm, no murmurs / rubs / gallops. No extremity edema. 2+ pedal pulses. No carotid bruits.  Abdomen: no tenderness, no masses palpated. No hepatosplenomegaly. Bowel sounds positive.  Musculoskeletal: no clubbing / cyanosis. No joint deformity upper and lower extremities. Good ROM, no contractures. Normal muscle tone.  Skin: no rashes, lesions, ulcers. No induration Neurologic: severe dementia, not cooperative for exam but moving extremities spontaneously.   Psychiatric: severe dementia.   Labs on Admission: I have personally reviewed following labs and imaging studies  CBC: Recent Labs  Lab 01/06/20 0807  WBC 7.2  NEUTROABS 6.1  HGB 12.9  HCT 38.5  MCV 104.3*  PLT 175   Basic Metabolic Panel: Recent Labs  Lab 01/06/20 0807  NA 139  K 4.2  CL 105  CO2 26  GLUCOSE 101*  BUN 19  CREATININE 0.79  CALCIUM 9.8   GFR: Estimated Creatinine Clearance: 67.2 mL/min (by C-G formula based  on SCr of 0.79 mg/dL). Liver Function Tests: Recent Labs  Lab 01/06/20 0807  AST 27  ALT 17  ALKPHOS 77  BILITOT 0.4  PROT 6.8  ALBUMIN 3.7   No results for input(s): LIPASE, AMYLASE in the last 168 hours. Recent Labs  Lab 01/06/20 0807  AMMONIA 20   Coagulation Profile: No results for input(s): INR, PROTIME in the last 168 hours. Cardiac Enzymes: No results for input(s): CKTOTAL, CKMB, CKMBINDEX, TROPONINI in the last 168 hours. BNP (last 3 results) No results for input(s): PROBNP in the last 8760 hours. HbA1C: No results for input(s): HGBA1C in the last 72 hours. CBG: Recent Labs  Lab 01/06/20 0815  GLUCAP 106*   Lipid Profile: No results for input(s): CHOL, HDL, LDLCALC, TRIG, CHOLHDL, LDLDIRECT in the last 72 hours. Thyroid Function Tests: No results for input(s): TSH, T4TOTAL, FREET4,  T3FREE, THYROIDAB in the last 72 hours. Anemia Panel: No results for input(s): VITAMINB12, FOLATE, FERRITIN, TIBC, IRON, RETICCTPCT in the last 72 hours. Urine analysis:    Component Value Date/Time   COLORURINE YELLOW 01/06/2020 0957   APPEARANCEUR CLOUDY (A) 01/06/2020 0957   LABSPEC 1.017 01/06/2020 0957   PHURINE 6.0 01/06/2020 0957   GLUCOSEU NEGATIVE 01/06/2020 0957   HGBUR SMALL (A) 01/06/2020 0957   BILIRUBINUR NEGATIVE 01/06/2020 0957   KETONESUR NEGATIVE 01/06/2020 0957   PROTEINUR NEGATIVE 01/06/2020 0957   NITRITE NEGATIVE 01/06/2020 0957   LEUKOCYTESUR LARGE (A) 01/06/2020 0957    Radiological Exams on Admission: CT Head Wo Contrast  Result Date: 01/06/2020 CLINICAL DATA:  Altered mental status. EXAM: CT HEAD WITHOUT CONTRAST TECHNIQUE: Contiguous axial images were obtained from the base of the skull through the vertex without intravenous contrast. COMPARISON:  11/02/2019 FINDINGS: Brain: There is no evidence for acute hemorrhage, hydrocephalus, mass lesion, or abnormal extra-axial fluid collection. No definite CT evidence for acute infarction. Diffuse loss of  parenchymal volume is consistent with atrophy. Patchy low attenuation in the deep hemispheric and periventricular white matter is nonspecific, but likely reflects chronic microvascular ischemic demyelination. Vascular: No hyperdense vessel or unexpected calcification. Skull: No evidence for fracture. No worrisome lytic or sclerotic lesion. Sinuses/Orbits: The visualized paranasal sinuses and mastoid air cells are clear. Visualized portions of the globes and intraorbital fat are unremarkable. Other: None. IMPRESSION: Stable.  No acute intracranial abnormality. Atrophy with chronic small vessel white matter ischemic disease. Electronically Signed   By: Kennith Center M.D.   On: 01/06/2020 09:34   DG Chest Port 1 View  Result Date: 01/06/2020 CLINICAL DATA:  Cough. EXAM: PORTABLE CHEST 1 VIEW COMPARISON:  09/23/2019 FINDINGS: Normal heart size. No pleural effusion or edema. Decreased lung volumes. No airspace opacities. IMPRESSION: 1. No acute cardiopulmonary abnormalities. Electronically Signed   By: Signa Kell M.D.   On: 01/06/2020 08:36   Assessment/Plan Principal Problem:   Acute metabolic encephalopathy Active Problems:   Severe dementia (HCC)   Essential hypertension, benign   Mixed hyperlipidemia   Generalized anxiety disorder   UTI (urinary tract infection)   Altered mental status, unspecified   Bright's disease   COPD (chronic obstructive pulmonary disease) (HCC)   1. Acute metabolic encephalopathy-suspecting that this is related to a severe UTI. She is being treated supportively with IV fluids, IV ceftriaxone as ordered follow urine culture and sensitivities.  Covid 19 test pending.  2. Altered mentation-she is high risk for CVA, CT head did not show acute findings, check MRI brain without contrast to rule out CVA. IV lorazepam ordered for sedation as needed. 3. UTI-continue ceftriaxone and supportive care with IV fluids and follow urine culture and sensitivity results. 4. COPD-stable  no respiratory symptoms at this time. 5. Essential hypertension-stable follow. 6. Hyperlipidemia-stable.  DVT prophylaxis: enoxaparin  Code Status: Full   Family Communication: attempted telephone, no answer  Disposition Plan: anticipate patient will return to [employer] called:   Admission status: INP   Standley Dakins MD [facility] How to contact the [facility] Attending or Consulting provider 7A - 7P or covering provider during after hours 7P -7A, for this patient?  1. Check the care team in [facility] and look for a) attending/consulting TRH provider listed and b) the [facility] team listed 2. Log into [email or web address] and use [place]'s universal password to access. If you do not have the password, please contact the hospital operator. 3. Locate the [facility] provider  you are looking for under [facility] and page to a number that you can be directly reached. 4. If you still have difficulty reaching the provider, please page the [facility] (Director on Call) for the Hospitalists listed on amion for assistance.   If 7PM-7AM, please contact night-coverage [email or web address] Password [facility]  01/06/2020, 12:41 PM

## 2020-01-07 DIAGNOSIS — E782 Mixed hyperlipidemia: Secondary | ICD-10-CM

## 2020-01-07 LAB — CBC WITH DIFFERENTIAL/PLATELET
Abs Immature Granulocytes: 0.02 10*3/uL (ref 0.00–0.07)
Basophils Absolute: 0.1 10*3/uL (ref 0.0–0.1)
Basophils Relative: 1 %
Eosinophils Absolute: 0 10*3/uL (ref 0.0–0.5)
Eosinophils Relative: 0 %
HCT: 35.7 % — ABNORMAL LOW (ref 36.0–46.0)
Hemoglobin: 11.7 g/dL — ABNORMAL LOW (ref 12.0–15.0)
Immature Granulocytes: 0 %
Lymphocytes Relative: 29 %
Lymphs Abs: 2.2 10*3/uL (ref 0.7–4.0)
MCH: 34.8 pg — ABNORMAL HIGH (ref 26.0–34.0)
MCHC: 32.8 g/dL (ref 30.0–36.0)
MCV: 106.3 fL — ABNORMAL HIGH (ref 80.0–100.0)
Monocytes Absolute: 0.6 10*3/uL (ref 0.1–1.0)
Monocytes Relative: 8 %
Neutro Abs: 4.5 10*3/uL (ref 1.7–7.7)
Neutrophils Relative %: 62 %
Platelets: 163 10*3/uL (ref 150–400)
RBC: 3.36 MIL/uL — ABNORMAL LOW (ref 3.87–5.11)
RDW: 12.5 % (ref 11.5–15.5)
WBC: 7.3 10*3/uL (ref 4.0–10.5)
nRBC: 0 % (ref 0.0–0.2)

## 2020-01-07 LAB — COMPREHENSIVE METABOLIC PANEL
ALT: 17 U/L (ref 0–44)
AST: 31 U/L (ref 15–41)
Albumin: 3.2 g/dL — ABNORMAL LOW (ref 3.5–5.0)
Alkaline Phosphatase: 65 U/L (ref 38–126)
Anion gap: 9 (ref 5–15)
BUN: 16 mg/dL (ref 8–23)
CO2: 24 mmol/L (ref 22–32)
Calcium: 9.3 mg/dL (ref 8.9–10.3)
Chloride: 108 mmol/L (ref 98–111)
Creatinine, Ser: 0.69 mg/dL (ref 0.44–1.00)
GFR calc Af Amer: >60 mL/min (ref >60)
GFR calc non Af Amer: >60 mL/min (ref >60)
Glucose, Bld: 76 mg/dL (ref 70–99)
Potassium: 3.9 mmol/L (ref 3.5–5.1)
Sodium: 141 mmol/L (ref 135–145)
Total Bilirubin: 0.5 mg/dL (ref 0.3–1.2)
Total Protein: 5.7 g/dL — ABNORMAL LOW (ref 6.5–8.1)

## 2020-01-07 LAB — URINE CULTURE

## 2020-01-07 LAB — HIV ANTIBODY (ROUTINE TESTING W REFLEX): HIV Screen 4th Generation wRfx: NONREACTIVE

## 2020-01-07 LAB — MAGNESIUM: Magnesium: 1.9 mg/dL (ref 1.7–2.4)

## 2020-01-07 NOTE — Progress Notes (Addendum)
Per PT recommendations, patient will be referred for SNF. CSW completed request for new PASRR as previous PASRR was for ALF. CSW called patient's grandson, Adora Yeh, to discuss SNF recommendations. CSW discussed patient's need for rehab and grandson provided verbal consent for SNF referral to be sent. FL2 completed and sent to SNFs. TOC awaiting bed offers.  Memory Argue, LCSW Transitions of Care Clinical Social Worker [facility] Ph: [phone]

## 2020-01-07 NOTE — Plan of Care (Signed)
Palliative: Thank you for this consult.  Unfortunately due to high volume of consults there will be a delay in a Palliative Provider seeing this patient.  Palliative Medicine will return to service on 01/12/2020 and will see patient at that time.  No charge  Lillia Carmel, NP Palliative Medicine Please call Palliative Medicine team phone with any questions [phone]. For individual providers please see AMION.

## 2020-01-07 NOTE — TOC Initial Note (Signed)
Transition of Care [facility]) - Initial/Assessment Note    Patient Details  Name: Susan Fowler MRN: [id number] Date of Birth: 25-Oct-1950  Transition of Care [facility]) CM/SW Contact:    Ewing Schlein, LCSW Phone Number: 01/07/2020, 11:30 AM  Clinical Narrative: TOC notified patient may be discharged tomorrow. CSW called [facility] and spoke with Tammy. Tammy reported patient has been a resident of [facility] since October 25, 2015. Tammy stated, "she could do nothing" at the time patient was brought to the hospital. Tammy reported [facility] will not take the patient back if the patient cannot stand or assist with transfer. CSW followed up with physician and a PT consult has been placed. TOC to follow.             Expected Discharge Plan: Assisted Living Barriers to Discharge: Continued Medical Work up  Patient Goals and CMS Choice   Expected Discharge Plan and Services Expected Discharge Plan: Assisted Living     Living arrangements for the past 2 months: Assisted Living Facility                  Prior Living Arrangements/Services Living arrangements for the past 2 months: Assisted Living Facility Lives with:: Facility Resident Patient language and need for interpreter reviewed:: Yes Do you feel safe going back to the place where you live?: Yes      Need for Family Participation in Patient Care: Yes (Comment)(Patient has severe dementia) Care giver support system in place?: Yes (comment)   Criminal Activity/Legal Involvement Pertinent to Current Situation/Hospitalization: No - Comment as needed  Activities of Daily Living Home Assistive Devices/Equipment: None ADL Screening (condition at time of admission) Patient's cognitive ability adequate to safely complete daily activities?: No Is the patient deaf or have difficulty hearing?: No Does the patient have difficulty seeing, even when wearing glasses/contacts?: Yes Does the patient have difficulty concentrating, remembering, or making  decisions?: Yes Patient able to express need for assistance with ADLs?: No Does the patient have difficulty dressing or bathing?: Yes Independently performs ADLs?: No Communication: Dependent Is this a change from baseline?: Pre-admission baseline Dressing (OT): Dependent Is this a change from baseline?: Pre-admission baseline Grooming: Dependent Is this a change from baseline?: Pre-admission baseline Feeding: Dependent Is this a change from baseline?: Pre-admission baseline Bathing: Dependent Is this a change from baseline?: Pre-admission baseline Toileting: Dependent Is this a change from baseline?: Pre-admission baseline In/Out Bed: Dependent Is this a change from baseline?: Pre-admission baseline Walks in Home: Dependent Is this a change from baseline?: Pre-admission baseline Does the patient have difficulty walking or climbing stairs?: Yes Weakness of Legs: Both Weakness of Arms/Hands: Both  Permission Sought/Granted    Emotional Assessment     Orientation: : Fluctuating Orientation (Suspected and/or reported [place])    Admission diagnosis:  Acute metabolic encephalopathy [G93.41] Patient Active Problem List   Diagnosis Date Noted  . Acute metabolic encephalopathy 01/06/2020  . UTI (urinary tract infection) 01/06/2020  . Severe dementia (HCC) 01/06/2020  . Altered mental status, unspecified 01/06/2020  . Bright's disease   . COPD (chronic obstructive pulmonary disease) (HCC)   . Generalized anxiety disorder 12/15/2014  . Precordial pain 06/23/2012  . Coronary atherosclerosis of native coronary artery 06/23/2012  . Tobacco abuse 06/23/2012  . Essential hypertension, benign 06/23/2012  . Mixed hyperlipidemia 06/23/2012   PCP:  Avon Gully, MD Pharmacy:   [facility] - [place], [place] - [address] [place] [address] Phone: [phone] Fax: [phone]  Readmission  Risk Interventions No flowsheet data found.

## 2020-01-07 NOTE — [place] FL2 (Signed)
[place] MEDICAID FL2 LEVEL OF CARE SCREENING TOOL     IDENTIFICATION  Patient Name: Susan Fowler Birthdate: [birth year] Sex: female Admission Date (Current Location): 01/06/2020  [place] and [place] Number:  Aaron Edelman [id number] K Facility and Address:  [facility],  [address] [place] [address]      Provider Number: [id number]  Attending Physician Name and Address:  Cleora Fleet, MD  Relative Name and Phone Number:  Laurita Peron (grandson) [facility]: [phone]    Current Level of Care: Hospital Recommended Level of Care: Skilled Nursing Facility Prior Approval Number:    Date Approved/Denied:   PASRR Number: [id number]  Discharge Plan: SNF    Current Diagnoses: Patient Active Problem List   Diagnosis Date Noted  . Acute metabolic encephalopathy 01/06/2020  . UTI (urinary tract infection) 01/06/2020  . Severe dementia (HCC) 01/06/2020  . Altered mental status, unspecified 01/06/2020  . Bright's disease   . COPD (chronic obstructive pulmonary disease) (HCC)   . Generalized anxiety disorder 12/15/2014  . Precordial pain 06/23/2012  . Coronary atherosclerosis of native coronary artery 06/23/2012  . Tobacco abuse 06/23/2012  . Essential hypertension, benign 06/23/2012  . Mixed hyperlipidemia 06/23/2012    Orientation RESPIRATION BLADDER Height & Weight     Self  Normal External catheter Weight: 175 lb 0.7 oz (79.4 kg) Height:  5\' 3"  (160 cm)  BEHAVIORAL SYMPTOMS/MOOD NEUROLOGICAL BOWEL NUTRITION STATUS      Continent Diet(Dysphagia diet)  AMBULATORY STATUS COMMUNICATION OF NEEDS Skin   Extensive Assist Does not communicate Skin abrasions(Right arm and sacrum)                       Personal Care Assistance Level of Assistance  Bathing, Feeding, Dressing Bathing Assistance: Maximum assistance Feeding assistance: Maximum assistance Dressing Assistance: Maximum assistance     Functional Limitations Info              SPECIAL CARE FACTORS FREQUENCY  PT (By licensed PT)     PT Frequency: 3x's weekly              Contractures Contractures Info: Present(RUE)    Additional Factors Info  Code Status Code Status Info: Full             Current Medications (01/07/2020):  This is the current hospital active medication list Current Facility-Administered Medications  Medication Dose Route Frequency Provider Last Rate Last Admin  . 0.9 %  sodium chloride infusion   Intravenous Continuous 03/08/2020, Clanford L, MD 140 mL/hr at 01/07/20 0744 Rate Change at 01/07/20 0744  . acetaminophen (TYLENOL) tablet 650 mg  650 mg Oral Q6H PRN Johnson, Clanford L, MD       Or  . acetaminophen (TYLENOL) suppository 650 mg  650 mg Rectal Q6H PRN Johnson, Clanford L, MD      . bisacodyl (DULCOLAX) EC tablet 5 mg  5 mg Oral Daily PRN Johnson, Clanford L, MD      . cefTRIAXone (ROCEPHIN) 1 g in sodium chloride 0.9 % 100 mL IVPB  1 g Intravenous Q24H Johnson, Clanford L, MD 200 mL/hr at 01/07/20 1121 1 g at 01/07/20 1121  . enoxaparin (LOVENOX) injection 40 mg  40 mg Subcutaneous Q24H Johnson, Clanford L, MD   40 mg at 01/06/20 1806  . feeding supplement (ENSURE ENLIVE) (ENSURE ENLIVE) liquid 237 mL  237 mL Oral BID BM Johnson, Clanford L, MD      . LORazepam (ATIVAN) injection 0.5 mg  0.5  mg Intravenous Q4H PRN Johnson, Clanford L, MD      . ondansetron (ZOFRAN) tablet 4 mg  4 mg Oral Q6H PRN Johnson, Clanford L, MD       Or  . ondansetron (ZOFRAN) injection 4 mg  4 mg Intravenous Q6H PRN Murlean Iba, MD         Discharge Medications: Please see discharge summary for a list of discharge medications.  Relevant Imaging Results:  Relevant Lab Results:   Additional Information SSN#: [id number]  Sherie Don, LCSW

## 2020-01-07 NOTE — Plan of Care (Signed)
  Problem: Acute Rehab PT Goals(only PT should resolve) Goal: Pt Will Go Supine/Side To Sit Outcome: Progressing Flowsheets (Taken 01/07/2020 1234) Pt will go Supine/Side to Sit: with minimal assist Goal: Patient Will Transfer Sit To/From Stand Outcome: Progressing Flowsheets (Taken 01/07/2020 1234) Patient will transfer sit to/from stand: with minimal assist Goal: Pt Will Transfer Bed To Chair/Chair To Bed Outcome: Progressing Flowsheets (Taken 01/07/2020 1234) Pt will Transfer Bed to Chair/Chair to Bed:  with min assist  with mod assist Goal: Pt Will Ambulate Outcome: Progressing Flowsheets (Taken 01/07/2020 1234) Pt will Ambulate:  10 feet  with moderate assist Note: Hand held assist   12:35 PM, 01/07/20 Ocie Bob, MPT Physical Therapist with [facility] [phone] office [phone] mobile phone

## 2020-01-07 NOTE — Evaluation (Signed)
Physical Therapy Evaluation Patient Details Name: Susan Fowler MRN: [id number] DOB: 1951/04/21 Today's Date: 01/07/2020   History of Present Illness  Susan Fowler is a 69 y.o. female with medical history significant of severe dementia with minimal baseline function, [facility] brought in by EMS when they noticed that the patient was drooling and leaning to the left and they were concerned about a possible stroke. The patient had chest congestion and was clinching her fists. She is nonverbal at baseline and requires full assistance for all ADLs including eating. The patient was noted to have a CBG of 128 reported by EMS.    Clinical Impression  Patient functioning near baseline for functional mobility and nonambulatory at baseline.  Patient requires Max verbal/tactile cueing to participate, once prompted patient able to stand take a few slow labored steps to transfer to chair, unable to use RW due to contractures in RUE, requires hand held assist for safety.  Patient tolerated sitting up in chair after therapy - nursing staff notified.  Patient will benefit from continued physical therapy in hospital and recommended venue below to increase strength, balance, endurance for safe ADLs and gait.      Follow Up Recommendations SNF;Supervision for mobility/OOB;Supervision - Intermittent    Equipment Recommendations  None recommended by PT    Recommendations for Other Services       Precautions / Restrictions Precautions Precautions: Fall Restrictions Weight Bearing Restrictions: No      Mobility  Bed Mobility Overal bed mobility: Needs Assistance Bed Mobility: Supine to Sit     Supine to sit: Mod assist     General bed mobility comments: increased time, labored movement  Transfers Overall transfer level: Needs assistance Equipment used: 1 person hand held assist Transfers: Sit to/from Stand;Stand Pivot Transfers Sit to Stand: Min assist;Mod  assist Stand pivot transfers: Mod assist       General transfer comment: increased time, labored movement  Ambulation/Gait Ambulation/Gait assistance: Mod assist;Max assist Gait Distance (Feet): 3 Feet Assistive device: 1 person hand held assist Gait Pattern/deviations: Decreased step length - left;Decreased stance time - right;Decreased stride length Gait velocity: slow   General Gait Details: limited to 3-4 slow labored side steps at bedside  Stairs            Wheelchair Mobility    Modified Rankin (Stroke Patients Only)       Balance Overall balance assessment: Needs assistance Sitting-balance support: Feet supported;No upper extremity supported Sitting balance-Leahy Scale: Fair Sitting balance - Comments: seated at EOB   Standing balance support: During functional activity;Single extremity supported Standing balance-Leahy Scale: Poor Standing balance comment: fair/poor with hand held assist                             Pertinent Vitals/Pain Pain Assessment: Faces Faces Pain Scale: No hurt    Home Living Family/patient expects to be discharged to:: Assisted living                 Additional Comments: Patient is poor historian    Prior Function Level of Independence: Needs assistance   Gait / Transfers Assistance Needed: non-ambulatory, assisted transfers  ADL's / Homemaking Assistance Needed: assisted by ALF staff        Hand Dominance        Extremity/Trunk Assessment   Upper Extremity Assessment Upper Extremity Assessment: RUE deficits/detail;LUE deficits/detail RUE Deficits / Details: grossly 0-1/5, arm held to  side secondary to contractures - nonfunctional RUE: Unable to fully assess due to immobilization(contractures) RUE Coordination: decreased gross motor;decreased fine motor LUE Deficits / Details: grossly -4/5 LUE Sensation: WNL LUE Coordination: WNL    Lower Extremity Assessment Lower Extremity Assessment:  Generalized weakness    Cervical / Trunk Assessment Cervical / Trunk Assessment: Normal  Communication   Communication: Expressive difficulties  Cognition Arousal/Alertness: Awake/alert Behavior During Therapy: Flat affect Overall Cognitive Status: History of cognitive impairments - at baseline                                 General Comments: requires constant verbal/tactile cueing to participate      General Comments      Exercises     Assessment/Plan    PT Assessment Patient needs continued PT services  PT Problem List Decreased strength;Decreased activity tolerance;Decreased balance;Decreased mobility       PT Treatment Interventions Balance training;Stair training;Functional mobility training;Therapeutic activities;Therapeutic exercise;Patient/family education    PT Goals (Current goals can be found in the Care Plan section)  Acute Rehab PT Goals Patient Stated Goal: not stated PT Goal Formulation: With patient Time For Goal Achievement: 01/21/20 Potential to Achieve Goals: Good    Frequency Min 3X/week   Barriers to discharge        Co-evaluation               AM-PAC PT "6 Clicks" Mobility  Outcome Measure Help needed turning from your back to your side while in a flat bed without using bedrails?: A Lot Help needed moving from lying on your back to sitting on the side of a flat bed without using bedrails?: A Lot Help needed moving to and from a bed to a chair (including a wheelchair)?: A Lot Help needed standing up from a chair using your arms (e.g., wheelchair or bedside chair)?: A Lot Help needed to walk in hospital room?: A Lot Help needed climbing 3-5 steps with a railing? : Total 6 Click Score: 11    End of Session   Activity Tolerance: Patient tolerated treatment well;Patient limited by fatigue Patient left: in chair;with call bell/phone within reach;with chair alarm set Nurse Communication: Mobility status PT Visit  Diagnosis: Unsteadiness on feet (R26.81);Other abnormalities of gait and mobility (R26.89);Muscle weakness (generalized) (M62.81)    Time: 9485-4627 PT Time Calculation (min) (ACUTE ONLY): 31 min   Charges:   PT Evaluation $PT Eval Moderate Complexity: 1 Mod PT Treatments $Therapeutic Activity: 23-37 mins        12:32 PM, 01/07/20 Lonell Grandchild, MPT Physical Therapist with [facility] [phone] office [phone] mobile phone

## 2020-01-07 NOTE — Progress Notes (Signed)
PROGRESS NOTE [facility]   CHAU SAVELL  ZOX:[id number]  DOB: October 20, 1950  DOA: 01/06/2020 PCP: Avon Gully, MD   Brief Admission Hx: 69 year old female with severe dementia with minimal baseline function, had been a permanent resident at the [facility] [place] [facility] brought in with drooling and leaning to the left.  She was noted to have a UTI.  MDM/Assessment & Plan:   1. Acute metabolic encephalopathy-suspecting that this is related to a severe UTI. She is being treated supportively with IV fluids, IV ceftriaxone as ordered follow urine culture and sensitivities.  Covid 19 test negative.  Pt appears to be closer to baseline at this time. No drooling. MRI brain negative for acute findings.  2. Altered mentation - MRI brain negative for CVA.  Appears to be approaching baseline.  3. UTI-continue ceftriaxone and supportive care with IV fluids and follow urine culture and sensitivity results. 4. COPD-stable no respiratory symptoms at this time. 5. Hypotension - this is likely her baseline BP.  We are hydrating and will follow.  Pt is asymptomatic from the soft BPs.  6. Hyperlipidemia-stable. 7. Severe dementia - with minimal baseline function, pt is full Code and goals of care should have been addressed a long time ago.  I have asked for a palliative medicine consultation.   DVT prophylaxis: enoxaparin  Code Status: Full   Family Communication: attempted telephone, no answer  Disposition Plan: anticipate return to SNF tomorrow Consults called:   Admission status: INP  Consultants:  Palliative   Procedures:  Antimicrobials:  Ceftriaxone 3/31>>   Subjective: Pt nonverbal.   Objective: Vitals:   01/06/20 1941 01/06/20 2130 01/07/20 0100 01/07/20 0600  BP:  (!) 82/48 (!) 82/62 (!) 81/52  Pulse:  80 79 (!) 51  Resp:  18 17 18   Temp:  98.4 F (36.9 C) 98.6 F (37 C) 98.6 F (37 C)  TempSrc:  Axillary Axillary Axillary  SpO2: 98% 95% 97% 94%  Weight:       Height:        Intake/Output Summary (Last 24 hours) at 01/07/2020 1330 Last data filed at 01/07/2020 0300 Gross per 24 hour  Intake 425 ml  Output 900 ml  Net -475 ml   Filed Weights   01/06/20 0744  Weight: 79.4 kg     REVIEW OF SYSTEMS  UTO due to severe dementia, nonverbal  Exam:  General exam: nonverbal, clenching fists, NAD. No drooling.   Respiratory system: Clear. No increased work of breathing. Cardiovascular system: S1 & S2 heard. No JVD, murmurs, gallops, clicks or pedal edema. Gastrointestinal system: Abdomen is nondistended, soft and nontender. Normal bowel sounds heard. Central nervous system:  No focal neurological deficits. Extremities: no CCE.  Data Reviewed: Basic Metabolic Panel: Recent Labs  Lab 01/06/20 0807 01/07/20 0504  NA 139 141  K 4.2 3.9  CL 105 108  CO2 26 24  GLUCOSE 101* 76  BUN 19 16  CREATININE 0.79 0.69  CALCIUM 9.8 9.3  MG  --  1.9   Liver Function Tests: Recent Labs  Lab 01/06/20 0807 01/07/20 0504  AST 27 31  ALT 17 17  ALKPHOS 77 65  BILITOT 0.4 0.5  PROT 6.8 5.7*  ALBUMIN 3.7 3.2*   No results for input(s): LIPASE, AMYLASE in the last 168 hours. Recent Labs  Lab 01/06/20 0807  AMMONIA 20   CBC: Recent Labs  Lab 01/06/20 0807 01/07/20 0504  WBC 7.2 7.3  NEUTROABS 6.1 4.5  HGB 12.9 11.7*  HCT 38.5 35.7*  MCV 104.3* 106.3*  PLT 175 163   Cardiac Enzymes: No results for input(s): CKTOTAL, CKMB, CKMBINDEX, TROPONINI in the last 168 hours. CBG (last 3)  Recent Labs    01/06/20 0815  GLUCAP 106*   Recent Results (from the past 240 hour(s))  Culture, blood (routine x 2)     Status: None (Preliminary result)   Collection Time: 01/06/20  8:07 AM   Specimen: BLOOD LEFT ARM  Result Value Ref Range Status   Specimen Description BLOOD LEFT ARM  Final   Special Requests Blood Culture adequate volume  Final   Culture   Final    NO GROWTH < 24 HOURS Performed at [facility], [address]  [place], [place] [address]    Report Status PENDING  Incomplete  Culture, blood (routine x 2)     Status: None (Preliminary result)   Collection Time: 01/06/20  8:07 AM   Specimen: BLOOD LEFT ARM  Result Value Ref Range Status   Specimen Description BLOOD LEFT ARM  Final   Special Requests Blood Culture adequate volume  Final   Culture   Final    NO GROWTH < 24 HOURS Performed at [facility], [address], [place], [place] [address]    Report Status PENDING  Incomplete  Urine culture     Status: Abnormal   Collection Time: 01/06/20  9:57 AM   Specimen: Urine, Clean Catch  Result Value Ref Range Status   Specimen Description   Final    URINE, CLEAN CATCH Performed at [facility], [address], [place], [place] [address]    Special Requests   Final    NONE Performed at [facility], [address] [place], [place] [address]    Culture MULTIPLE SPECIES PRESENT, SUGGEST RECOLLECTION (A)  Final   Report Status 01/07/2020 FINAL  Final  SARS CORONAVIRUS 2 (TAT 6-24 HRS) Nasopharyngeal Nasopharyngeal Swab     Status: None   Collection Time: 01/06/20 11:13 AM   Specimen: Nasopharyngeal Swab  Result Value Ref Range Status   SARS Coronavirus 2 NEGATIVE NEGATIVE Final    Comment: (NOTE) SARS-CoV-2 target nucleic acids are NOT DETECTED. The SARS-CoV-2 RNA is generally detectable in upper and lower respiratory specimens during the acute phase of infection. Negative results do not preclude SARS-CoV-2 infection, do not rule out co-infections with other pathogens, and should not be used as the sole basis for treatment or other patient management decisions. Negative results must be combined with clinical observations, patient history, and epidemiological information. The expected result is Negative. Fact Sheet for Patients: SugarRoll.be Fact Sheet for Healthcare Providers: https://www.woods-mathews.com/ This test is not yet approved or cleared by the  [place] FDA and  has been authorized for detection and/or diagnosis of SARS-CoV-2 by FDA under an Emergency Use Authorization (EUA). This EUA will remain  in effect (meaning this test can be used) for the duration of the COVID-19 declaration under Section 56 4(b)(1) of the Act, 21 U.S.C. section 360bbb-3(b)(1), unless the authorization is terminated or revoked sooner. Performed at [facility], [place] [address] [place], [place] [address]   MRSA PCR Screening     Status: None   Collection Time: 01/06/20  3:39 PM   Specimen: Nasopharyngeal  Result Value Ref Range Status   MRSA by PCR NEGATIVE NEGATIVE Final    Comment:        The GeneXpert MRSA Assay (FDA approved for NASAL specimens only), is one component of a  comprehensive MRSA colonization surveillance program. It is not intended to diagnose MRSA infection nor to guide or monitor treatment for MRSA infections. Performed at [facility], [address], [place], [place] [address]      Studies: CT Head Wo Contrast  Result Date: 01/06/2020 CLINICAL DATA:  Altered mental status. EXAM: CT HEAD WITHOUT CONTRAST TECHNIQUE: Contiguous axial images were obtained from the base of the skull through the vertex without intravenous contrast. COMPARISON:  11/02/2019 FINDINGS: Brain: There is no evidence for acute hemorrhage, hydrocephalus, mass lesion, or abnormal extra-axial fluid collection. No definite CT evidence for acute infarction. Diffuse loss of parenchymal volume is consistent with atrophy. Patchy low attenuation in the deep hemispheric and periventricular white matter is nonspecific, but likely reflects chronic microvascular ischemic demyelination. Vascular: No hyperdense vessel or unexpected calcification. Skull: No evidence for fracture. No worrisome lytic or sclerotic lesion. Sinuses/Orbits: The visualized paranasal sinuses and mastoid air cells are clear. Visualized portions of the globes and intraorbital fat are  unremarkable. Other: None. IMPRESSION: Stable.  No acute intracranial abnormality. Atrophy with chronic small vessel white matter ischemic disease. Electronically Signed   By: Kennith Center M.D.   On: 01/06/2020 09:34   MR BRAIN WO CONTRAST  Result Date: 01/06/2020 CLINICAL DATA:  Neuro deficit.  Rule out stroke EXAM: MRI HEAD WITHOUT CONTRAST TECHNIQUE: Multiplanar, multiecho pulse sequences of the brain and surrounding structures were obtained without intravenous contrast. COMPARISON:  CT head 01/06/2020 FINDINGS: Brain: Motion degraded study Negative for acute infarct. Mild chronic microvascular ischemic changes in the white matter. Mild atrophy without hydrocephalus. Negative for hemorrhage or mass Vascular: Normal arterial flow voids. Skull and upper cervical spine: Negative Sinuses/Orbits: Paranasal sinuses clear. Bilateral cataract extraction Other: None IMPRESSION: Motion degraded study No acute abnormality. Electronically Signed   By: Marlan Palau M.D.   On: 01/06/2020 16:50   DG Chest Port 1 View  Result Date: 01/06/2020 CLINICAL DATA:  Cough. EXAM: PORTABLE CHEST 1 VIEW COMPARISON:  09/23/2019 FINDINGS: Normal heart size. No pleural effusion or edema. Decreased lung volumes. No airspace opacities. IMPRESSION: 1. No acute cardiopulmonary abnormalities. Electronically Signed   By: Signa Kell M.D.   On: 01/06/2020 08:36   Scheduled Meds: . enoxaparin (LOVENOX) injection  40 mg Subcutaneous Q24H  . feeding supplement (ENSURE ENLIVE)  237 mL Oral BID BM   Continuous Infusions: . sodium chloride 140 mL/hr at 01/07/20 0744  . cefTRIAXone (ROCEPHIN)  IV 1 g (01/07/20 1121)    Principal Problem:   Acute metabolic encephalopathy Active Problems:   Severe dementia (HCC)   Essential hypertension, benign   Mixed hyperlipidemia   Generalized anxiety disorder   UTI (urinary tract infection)   Altered mental status, unspecified   Bright's disease   COPD (chronic obstructive pulmonary  disease) (HCC)  Time spent:   Standley Dakins, MD [facility] 01/07/2020, 1:30 PM    LOS: 1 day  How to contact the [facility] Attending or Consulting provider 7A - 7P or covering provider during after hours 7P -7A, for this patient?  1. Check the care team in [facility] and look for a) attending/consulting TRH provider listed and b) the [facility] team listed 2. Log into [email or web address] and use [place]'s universal password to access. If you do not have the password, please contact the hospital operator. 3. Locate the [facility] provider you are looking for under [facility] and page to a number that you can be directly reached. 4. If you still have difficulty reaching the provider, please  page the [facility] (Director on Call) for the Hospitalists listed on amion for assistance.

## 2020-01-07 NOTE — Progress Notes (Signed)
Notified G lama of soft BP's this shift. No change in neuro status. New orders. Will implement.

## 2020-01-07 NOTE — Progress Notes (Signed)
Patient has been lethargic but arousable to voice today. Unable to eat due to lethargy. This nurse entered room at dinner and patient had eyes open and turned head when this nurse said "hey." Patient consumed 100% of dinner provided in addition to ensure and ice cream. Patient showed no difficulty with swallowing, HOB at 45 degrees. HOB remained up after finishing dinner. Will continue to monitor.

## 2020-01-08 DIAGNOSIS — E860 Dehydration: Secondary | ICD-10-CM | POA: Diagnosis present

## 2020-01-08 DIAGNOSIS — R4701 Aphasia: Secondary | ICD-10-CM | POA: Diagnosis present

## 2020-01-08 LAB — CBC WITH DIFFERENTIAL/PLATELET
Abs Immature Granulocytes: 0.01 10*3/uL (ref 0.00–0.07)
Basophils Absolute: 0.1 10*3/uL (ref 0.0–0.1)
Basophils Relative: 1 %
Eosinophils Absolute: 0.1 10*3/uL (ref 0.0–0.5)
Eosinophils Relative: 1 %
HCT: 38.5 % (ref 36.0–46.0)
Hemoglobin: 12.7 g/dL (ref 12.0–15.0)
Immature Granulocytes: 0 %
Lymphocytes Relative: 33 %
Lymphs Abs: 1.8 10*3/uL (ref 0.7–4.0)
MCH: 34 pg (ref 26.0–34.0)
MCHC: 33 g/dL (ref 30.0–36.0)
MCV: 103.2 fL — ABNORMAL HIGH (ref 80.0–100.0)
Monocytes Absolute: 0.4 10*3/uL (ref 0.1–1.0)
Monocytes Relative: 8 %
Neutro Abs: 3 10*3/uL (ref 1.7–7.7)
Neutrophils Relative %: 57 %
Platelets: 167 10*3/uL (ref 150–400)
RBC: 3.73 MIL/uL — ABNORMAL LOW (ref 3.87–5.11)
RDW: 11.9 % (ref 11.5–15.5)
WBC: 5.3 10*3/uL (ref 4.0–10.5)
nRBC: 0 % (ref 0.0–0.2)

## 2020-01-08 LAB — COMPREHENSIVE METABOLIC PANEL
ALT: 17 U/L (ref 0–44)
AST: 31 U/L (ref 15–41)
Albumin: 3.2 g/dL — ABNORMAL LOW (ref 3.5–5.0)
Alkaline Phosphatase: 70 U/L (ref 38–126)
Anion gap: 9 (ref 5–15)
BUN: 12 mg/dL (ref 8–23)
CO2: 22 mmol/L (ref 22–32)
Calcium: 9.1 mg/dL (ref 8.9–10.3)
Chloride: 109 mmol/L (ref 98–111)
Creatinine, Ser: 0.62 mg/dL (ref 0.44–1.00)
GFR calc Af Amer: >60 mL/min (ref >60)
GFR calc non Af Amer: >60 mL/min (ref >60)
Glucose, Bld: 90 mg/dL (ref 70–99)
Potassium: 3.6 mmol/L (ref 3.5–5.1)
Sodium: 140 mmol/L (ref 135–145)
Total Bilirubin: 0.8 mg/dL (ref 0.3–1.2)
Total Protein: 6 g/dL — ABNORMAL LOW (ref 6.5–8.1)

## 2020-01-08 LAB — MAGNESIUM: Magnesium: 1.7 mg/dL (ref 1.7–2.4)

## 2020-01-08 MED ORDER — CEFDINIR 300 MG PO CAPS: 300.0000 mg | ORAL_CAPSULE | Freq: Two times a day (BID) | ORAL | 0 refills | Status: AC

## 2020-01-08 MED ORDER — ENSURE ENLIVE PO LIQD: 237.0000 mL | Freq: Two times a day (BID) | ORAL | 12 refills | Status: AC

## 2020-01-08 MED ORDER — ALPRAZOLAM 0.5 MG PO TABS: 0.5000 mg | ORAL_TABLET | Freq: Every day | ORAL | 0 refills | Status: AC

## 2020-01-08 NOTE — TOC Progression Note (Signed)
Transition of Care [facility]) - Progression Note    Patient Details  Name: Susan Fowler MRN: [id number] Date of Birth: 06-10-1951  [facility] CM/SW Contact  Ewing Schlein, LCSW Phone Number: 01/08/2020, 3:08 PM  Clinical Narrative: CSW called patient's grandson, Kamariah Fruchter, to discuss bed offers. Grandson selected [facility]. CSW called Doug with [facility] and was informed patient will need 3 night stay, but can discharge to [facility] tomorrow. FL2 and therapy notes faxed to [facility]. Discharge summary will need to be faxed to [phone] over the weekend.  Expected Discharge Plan: Assisted Living Barriers to Discharge: Continued Medical Work up  Expected Discharge Plan and Services Expected Discharge Plan: Assisted Living   Living arrangements for the past 2 months: Assisted Living Facility Expected Discharge Date: 01/08/20                Readmission Risk Interventions No flowsheet data found.

## 2020-01-08 NOTE — Care Management Important Message (Signed)
Important Message  Patient Details  Name: Susan Fowler MRN: [id number] Date of Birth: [birth year]   Medicare Important Message Given:  Yes     Corey Harold 01/08/2020, 11:47 AM

## 2020-01-08 NOTE — Discharge Summary (Addendum)
Physician Discharge Summary  Susan Fowler BMW:[id number] DOB: 1951/05/28 DOA: 01/06/2020  PCP: Rosita Fire, MD  Admit date: 01/06/2020 Discharge date: 01/09/2020  Disposition:  SNF  Recommendations  1. Please consider outpatient pallliative medicine consultation 2. Assist with feeding and drinking.  Discharge Condition: STABLE   CODE STATUS: FULL    Brief Hospitalization Summary: Please see all hospital notes, images, labs for full details of the hospitalization. ADMISSION HPI: Susan Fowler is a 69 y.o. female with medical history significant of severe dementia with minimal baseline function, [facility] brought in by EMS when they noticed that the patient was drooling and leaning to the left and they were concerned about a possible stroke. The patient had chest congestion and was clinching her fists. She is nonverbal at baseline and requires full assistance for all ADLs including eating. The patient was noted to have a CBG of 128 reported by EMS.  ED Course: Patient arrived with a 98.0 rectal temperature, blood pressure 115/71, pulse ox 96% on room air, weight 79.4 kg, poorly responsive, clenched fists, CT head with no acute obvious injury or CVA, CMP and CBC stable and unremarkable urinalysis with large leukocytes and greater than 50 WBC per high-power field, chest x-ray no acute abnormalities. Patient was started on IV ceftriaxone and admission was requested.  Brief Admission Hx: 70 year old female with severe dementia with minimal baseline function, had [facility] a [facility] brought in with drooling and leaning to the left.  She was noted to have a UTI.  MDM/Assessment & Plan:   1. Acute metabolic encephalopathy-RESOLVED. Pt seems to be at reported baseline. Suspect that this was related to a severe UTI. She was treated supportively with IV fluids, IV ceftriaxone as ordered follow urine culture and  sensitivities.Covid 19 test negative.  Pt appears to be closer to baseline at this time. No drooling. MRI brain negative for acute findings. 2. Altered mentation RESOLVED - MRI brain negative for CVA.  Appears to be baseline.  3. UTI-Treated with IV ceftriaxone and supportive care with IV fluids. DC on 2 more days of omnicef to complete course.  4. COPD-stable no respiratory symptoms at this time. 5. Hypotension - RESOLVED.  secondary to dehydration, resolved with IV fluids.    6. Hyperlipidemia-stable. 7. Severe dementia - with minimal baseline function, pt is full Code and goals of care should have been addressed a long time ago.  I recommend continuing palliative medicine discussions.   DVT prophylaxis:enoxaparin Code Status:Full Disposition Plan:SNF Consults called:palliative medicine Admission status:INP  Consultants:  Palliative  Discharge Diagnoses:  Principal Problem:   Acute metabolic encephalopathy Active Problems:   Severe dementia ([place])   Essential hypertension, benign   Mixed hyperlipidemia   Generalized anxiety disorder   UTI (urinary tract infection)   Altered mental status, unspecified   Bright's disease   COPD (chronic obstructive pulmonary disease) (HCC)   Dehydration   Mostly Nonverbal Baseline  Discharge Instructions:  Allergies as of 01/09/2020   No Known Allergies     Medication List    TAKE these medications   acetaminophen 325 MG tablet Commonly known as: TYLENOL Take 2 tablets (650 mg total) by mouth in the morning, at noon, and at bedtime. What changed:   when to take this  reasons to take this   ALPRAZolam 0.5 MG tablet Commonly known as: XANAX Take 1 tablet (0.5 mg total) by mouth at bedtime.   aspirin EC 81 MG tablet  Take 81 mg by mouth daily.   cefdinir 300 MG capsule Commonly known as: OMNICEF Take 1 capsule (300 mg total) by mouth 2 (two) times daily for 2 days.   citalopram 20 MG tablet Commonly known as:  CELEXA Take 20 mg by mouth daily.   Enulose 10 GM/15ML Soln Generic drug: lactulose (encephalopathy) Take 20 g by mouth daily as needed (constipation).   feeding supplement (ENSURE ENLIVE) Liqd Take 237 mLs by mouth 2 (two) times daily between meals.   melatonin 3 MG Tabs tablet Take 3 mg by mouth at bedtime.   Namzaric 28-10 MG Cp24 Generic drug: Memantine HCl-Donepezil HCl Take 1 capsule by mouth daily.   simvastatin 40 MG tablet Commonly known as: ZOCOR Take 40 mg by mouth daily.   SYSTANE BALANCE OP Apply 1 drop to eye in the morning, at noon, and at bedtime. Instill 1 drop in both eyes three times a day.   Vitamin D 50 MCG (2000 UT) Caps Take 1 capsule by mouth daily. What changed: Another medication with the same name was removed. Continue taking this medication, and follow the directions you see here.   vitamin E 180 MG (400 UNITS) capsule Take 400 Units by mouth daily.      Contact information for after-discharge care    Destination    [facility] .   Service: Skilled Nursing Contact information: [address] [place] [address] [phone]             No Known Allergies Allergies as of 01/09/2020   No Known Allergies     Medication List    TAKE these medications   acetaminophen 325 MG tablet Commonly known as: TYLENOL Take 2 tablets (650 mg total) by mouth in the morning, at noon, and at bedtime. What changed:   when to take this  reasons to take this   ALPRAZolam 0.5 MG tablet Commonly known as: XANAX Take 1 tablet (0.5 mg total) by mouth at bedtime.   aspirin EC 81 MG tablet Take 81 mg by mouth daily.   cefdinir 300 MG capsule Commonly known as: OMNICEF Take 1 capsule (300 mg total) by mouth 2 (two) times daily for 2 days.   citalopram 20 MG tablet Commonly known as: CELEXA Take 20 mg by mouth daily.   Enulose 10 GM/15ML Soln Generic drug: lactulose (encephalopathy) Take 20 g by mouth daily  as needed (constipation).   feeding supplement (ENSURE ENLIVE) Liqd Take 237 mLs by mouth 2 (two) times daily between meals.   melatonin 3 MG Tabs tablet Take 3 mg by mouth at bedtime.   Namzaric 28-10 MG Cp24 Generic drug: Memantine HCl-Donepezil HCl Take 1 capsule by mouth daily.   simvastatin 40 MG tablet Commonly known as: ZOCOR Take 40 mg by mouth daily.   SYSTANE BALANCE OP Apply 1 drop to eye in the morning, at noon, and at bedtime. Instill 1 drop in both eyes three times a day.   Vitamin D 50 MCG (2000 UT) Caps Take 1 capsule by mouth daily. What changed: Another medication with the same name was removed. Continue taking this medication, and follow the directions you see here.   vitamin E 180 MG (400 UNITS) capsule Take 400 Units by mouth daily.       Procedures/Studies: CT Head Wo Contrast  Result Date: 01/06/2020 CLINICAL DATA:  Altered mental status. EXAM: CT HEAD WITHOUT CONTRAST TECHNIQUE: Contiguous axial images were obtained from the base of the skull through  the vertex without intravenous contrast. COMPARISON:  11/02/2019 FINDINGS: Brain: There is no evidence for acute hemorrhage, hydrocephalus, mass lesion, or abnormal extra-axial fluid collection. No definite CT evidence for acute infarction. Diffuse loss of parenchymal volume is consistent with atrophy. Patchy low attenuation in the deep hemispheric and periventricular white matter is nonspecific, but likely reflects chronic microvascular ischemic demyelination. Vascular: No hyperdense vessel or unexpected calcification. Skull: No evidence for fracture. No worrisome lytic or sclerotic lesion. Sinuses/Orbits: The visualized paranasal sinuses and mastoid air cells are clear. Visualized portions of the globes and intraorbital fat are unremarkable. Other: None. IMPRESSION: Stable.  No acute intracranial abnormality. Atrophy with chronic small vessel white matter ischemic disease. Electronically Signed   By: Kennith Center M.D.   On: 01/06/2020 09:34   MR BRAIN WO CONTRAST  Result Date: 01/06/2020 CLINICAL DATA:  Neuro deficit.  Rule out stroke EXAM: MRI HEAD WITHOUT CONTRAST TECHNIQUE: Multiplanar, multiecho pulse sequences of the brain and surrounding structures were obtained without intravenous contrast. COMPARISON:  CT head 01/06/2020 FINDINGS: Brain: Motion degraded study Negative for acute infarct. Mild chronic microvascular ischemic changes in the white matter. Mild atrophy without hydrocephalus. Negative for hemorrhage or mass Vascular: Normal arterial flow voids. Skull and upper cervical spine: Negative Sinuses/Orbits: Paranasal sinuses clear. Bilateral cataract extraction Other: None IMPRESSION: Motion degraded study No acute abnormality. Electronically Signed   By: Marlan Palau M.D.   On: 01/06/2020 16:50   DG Chest Port 1 View  Result Date: 01/06/2020 CLINICAL DATA:  Cough. EXAM: PORTABLE CHEST 1 VIEW COMPARISON:  09/23/2019 FINDINGS: Normal heart size. No pleural effusion or edema. Decreased lung volumes. No airspace opacities. IMPRESSION: 1. No acute cardiopulmonary abnormalities. Electronically Signed   By: Signa Kell M.D.   On: 01/06/2020 08:36     Subjective: Pt nonverbal with severe dementia.   Discharge Exam: Vitals:   01/09/20 0436 01/09/20 0900  BP: (!) 129/51   Pulse: 91   Resp: 16   Temp: 99.1 F (37.3 C) (!) 100.4 F (38 C)  SpO2: 93%    Vitals:   01/08/20 2019 01/08/20 2353 01/09/20 0436 01/09/20 0900  BP: 137/66 126/88 (!) 129/51   Pulse: 99 95 91   Resp: 17 16 16    Temp: (!) 100.5 F (38.1 C) 99.7 F (37.6 C) 99.1 F (37.3 C) (!) 100.4 F (38 C)  TempSrc: Axillary Axillary Axillary Axillary  SpO2: 95% 96% 93%   Weight:      Height:       General exam: nonverbal, clenching fists, NAD. No drooling.   Respiratory system: Clear. No increased work of breathing. Cardiovascular system: S1 & S2 heard. No JVD, murmurs, gallops, clicks or pedal  edema. Gastrointestinal system: Abdomen is nondistended, soft and nontender. Normal bowel sounds heard. Central nervous system:  No focal neurological deficits. Extremities: no CCE.   The results of significant diagnostics from this hospitalization (including imaging, microbiology, ancillary and laboratory) are listed below for reference.     Microbiology: Recent Results (from the past 240 hour(s))  Culture, blood (routine x 2)     Status: None (Preliminary result)   Collection Time: 01/06/20  8:07 AM   Specimen: BLOOD LEFT ARM  Result Value Ref Range Status   Specimen Description BLOOD LEFT ARM  Final   Special Requests Blood Culture adequate volume  Final   Culture   Final    NO GROWTH 3 DAYS Performed at [facility], [address] [place], [place]  Report Status PENDING  Incomplete  Culture, blood (routine x 2)     Status: None (Preliminary result)   Collection Time: 01/06/20  8:07 AM   Specimen: BLOOD LEFT ARM  Result Value Ref Range Status   Specimen Description BLOOD LEFT ARM  Final   Special Requests Blood Culture adequate volume  Final   Culture   Final    NO GROWTH 3 DAYS Performed at [facility], [address], [place], [place] [address]    Report Status PENDING  Incomplete  Urine culture     Status: Abnormal   Collection Time: 01/06/20  9:57 AM   Specimen: Urine, Clean Catch  Result Value Ref Range Status   Specimen Description   Final    URINE, CLEAN CATCH Performed at [facility], [address] [place], [place] [address]    Special Requests   Final    NONE Performed at [facility], [address] [place], [place] [address]    Culture MULTIPLE SPECIES PRESENT, SUGGEST RECOLLECTION (A)  Final   Report Status 01/07/2020 FINAL  Final  SARS CORONAVIRUS 2 (TAT 6-24 HRS) Nasopharyngeal Nasopharyngeal Swab     Status: None   Collection Time: 01/06/20 11:13 AM   Specimen: Nasopharyngeal Swab  Result Value Ref Range Status   SARS Coronavirus 2  NEGATIVE NEGATIVE Final    Comment: (NOTE) SARS-CoV-2 target nucleic acids are NOT DETECTED. The SARS-CoV-2 RNA is generally detectable in upper and lower respiratory specimens during the acute phase of infection. Negative results do not preclude SARS-CoV-2 infection, do not rule out co-infections with other pathogens, and should not be used as the sole basis for treatment or other patient management decisions. Negative results must be combined with clinical observations, patient history, and epidemiological information. The expected result is Negative. Fact Sheet for Patients: HairSlick.no Fact Sheet for Healthcare Providers: quierodirigir.com This test is not yet approved or cleared by the [place] FDA and  has been authorized for detection and/or diagnosis of SARS-CoV-2 by FDA under an Emergency Use Authorization (EUA). This EUA will remain  in effect (meaning this test can be used) for the duration of the COVID-19 declaration under Section 56 4(b)(1) of the Act, 21 U.S.C. section 360bbb-3(b)(1), unless the authorization is terminated or revoked sooner. Performed at [facility], [address] [place], [place] [address]   MRSA PCR Screening     Status: None   Collection Time: 01/06/20  3:39 PM   Specimen: Nasopharyngeal  Result Value Ref Range Status   MRSA by PCR NEGATIVE NEGATIVE Final    Comment:        The GeneXpert MRSA Assay (FDA approved for NASAL specimens only), is one component of a comprehensive MRSA colonization surveillance program. It is not intended to diagnose MRSA infection nor to guide or monitor treatment for MRSA infections. Performed at [facility], [address], [place], [place] [address]      Labs: BNP (last 3 results) No results for input(s): BNP in the last 8760 hours. Basic Metabolic Panel: Recent Labs  Lab 01/06/20 0807 01/07/20 0504 01/08/20 0456 01/09/20 0618  NA  139 141 140 142  K 4.2 3.9 3.6 3.6  CL 105 108 109 109  CO2 26 24 22 25   GLUCOSE 101* 76 90 104*  BUN 19 16 12 16   CREATININE 0.79 0.69 0.62 0.65  CALCIUM 9.8 9.3 9.1 9.1  MG  --  1.9 1.7 1.6*   Liver Function Tests: Recent Labs  Lab  01/06/20 0807 01/07/20 0504 01/08/20 0456 01/09/20 0618  AST ALT ALKPHOS 77 65 70 62  BILITOT 0.4 0.5 0.8 0.7  PROT 6.8 5.7* 6.0* 5.4*  ALBUMIN 3.7 3.2* 3.2* 2.9*   No results for input(s): LIPASE, AMYLASE in the last 168 hours. Recent Labs  Lab 01/06/20 0807  AMMONIA 20   CBC: Recent Labs  Lab 01/06/20 0807 01/07/20 0504 01/08/20 0456 01/09/20 0618  WBC 7.2 7.3 5.3 7.4  NEUTROABS 6.1 4.5 3.0 5.1  HGB 12.9 11.7* 12.7 12.0  HCT 38.5 35.7* 38.5 36.2  MCV 104.3* 106.3* 103.2* 104.6*  PLT 175 163 167 174   Cardiac Enzymes: No results for input(s): CKTOTAL, CKMB, CKMBINDEX, TROPONINI in the last 168 hours. BNP: Invalid input(s): POCBNP CBG: Recent Labs  Lab 01/06/20 0815  GLUCAP 106*   D-Dimer No results for input(s): DDIMER in the last 72 hours. Hgb A1c No results for input(s): HGBA1C in the last 72 hours. Lipid Profile No results for input(s): CHOL, HDL, LDLCALC, TRIG, CHOLHDL, LDLDIRECT in the last 72 hours. Thyroid function studies No results for input(s): TSH, T4TOTAL, T3FREE, THYROIDAB in the last 72 hours.  Invalid input(s): FREET3 Anemia work up No results for input(s): VITAMINB12, FOLATE, FERRITIN, TIBC, IRON, RETICCTPCT in the last 72 hours. Urinalysis    Component Value Date/Time   COLORURINE YELLOW 01/06/2020 0957   APPEARANCEUR CLOUDY (A) 01/06/2020 0957   LABSPEC 1.017 01/06/2020 0957   PHURINE 6.0 01/06/2020 0957   GLUCOSEU NEGATIVE 01/06/2020 0957   HGBUR SMALL (A) 01/06/2020 0957   BILIRUBINUR NEGATIVE 01/06/2020 0957   KETONESUR NEGATIVE 01/06/2020 0957   PROTEINUR NEGATIVE 01/06/2020 0957   NITRITE NEGATIVE 01/06/2020 0957   LEUKOCYTESUR LARGE (A) 01/06/2020 0957    Sepsis Labs Invalid input(s): PROCALCITONIN,  WBC,  LACTICIDVEN Microbiology Recent Results (from the past 240 hour(s))  Culture, blood (routine x 2)     Status: None (Preliminary result)   Collection Time: 01/06/20  8:07 AM   Specimen: BLOOD LEFT ARM  Result Value Ref Range Status   Specimen Description BLOOD LEFT ARM  Final   Special Requests Blood Culture adequate volume  Final   Culture   Final    NO GROWTH 3 DAYS Performed at [facility], [address], [place], [place] [address]    Report Status PENDING  Incomplete  Culture, blood (routine x 2)     Status: None (Preliminary result)   Collection Time: 01/06/20  8:07 AM   Specimen: BLOOD LEFT ARM  Result Value Ref Range Status   Specimen Description BLOOD LEFT ARM  Final   Special Requests Blood Culture adequate volume  Final   Culture   Final    NO GROWTH 3 DAYS Performed at [facility], [address], [place], [place] [address]    Report Status PENDING  Incomplete  Urine culture     Status: Abnormal   Collection Time: 01/06/20  9:57 AM   Specimen: Urine, Clean Catch  Result Value Ref Range Status   Specimen Description   Final    URINE, CLEAN CATCH Performed at [facility], [address] [place], [place] [address]    Special Requests   Final    NONE Performed at [facility], [address], [place], [place] [address]    Culture MULTIPLE SPECIES PRESENT, SUGGEST RECOLLECTION (A)  Final   Report Status 01/07/2020 FINAL  Final  SARS CORONAVIRUS 2 (TAT 6-24 HRS) Nasopharyngeal Nasopharyngeal Swab  Status: None   Collection Time: 01/06/20 11:13 AM   Specimen: Nasopharyngeal Swab  Result Value Ref Range Status   SARS Coronavirus 2 NEGATIVE NEGATIVE Final    Comment: (NOTE) SARS-CoV-2 target nucleic acids are NOT DETECTED. The SARS-CoV-2 RNA is generally detectable in upper and lower respiratory specimens during the acute phase of infection. Negative results do not preclude SARS-CoV-2 infection, do not rule  out co-infections with other pathogens, and should not be used as the sole basis for treatment or other patient management decisions. Negative results must be combined with clinical observations, patient history, and epidemiological information. The expected result is Negative. Fact Sheet for Patients: HairSlick.nohttps://www.fda.gov/media/138098/download Fact Sheet for Healthcare Providers: quierodirigir.comhttps://www.fda.gov/media/138095/download This test is not yet approved or cleared by the [place] FDA and  has been authorized for detection and/or diagnosis of SARS-CoV-2 by FDA under an Emergency Use Authorization (EUA). This EUA will remain  in effect (meaning this test can be used) for the duration of the COVID-19 declaration under Section 56 4(b)(1) of the Act, 21 U.S.C. section 360bbb-3(b)(1), unless the authorization is terminated or revoked sooner. Performed at [facility], [address] [place], [place] [address]   MRSA PCR Screening     Status: None   Collection Time: 01/06/20  3:39 PM   Specimen: Nasopharyngeal  Result Value Ref Range Status   MRSA by PCR NEGATIVE NEGATIVE Final    Comment:        The GeneXpert MRSA Assay (FDA approved for NASAL specimens only), is one component of a comprehensive MRSA colonization surveillance program. It is not intended to diagnose MRSA infection nor to guide or monitor treatment for MRSA infections. Performed at [facility], [address] [place], [place] [address]    Time coordinating discharge: 33 minutes   SIGNED:  Standley Dakinslanford Jeremia Groot, MD  [facility] 01/09/2020, 9:01 AM How to contact the [address] Attending or Consulting provider 7A - 7P or covering provider during after hours 7P -7A, for this patient?  1. Check the care team in [facility] and look for a) attending/consulting [facility] provider listed and b) the [facility] team listed 2. Log into [email or web address] and use 's universal password to access. If you do not have the password,  please contact the hospital operator. 3. Locate the [facility] provider you are looking for under [facility] and page to a number that you can be directly reached. 4. If you still have difficulty reaching the provider, please page the [facility] (Director on Call) for the Hospitalists listed on amion for assistance.

## 2020-01-08 NOTE — Progress Notes (Signed)
Physical Therapy Treatment Patient Details Name: Susan Fowler MRN: [id number] DOB: January 18, 1951 Today's Date: 01/08/2020    History of Present Illness Susan Fowler is a 69 y.o. female with medical history significant of severe dementia with minimal baseline function, [facility] brought in by EMS when they noticed that the patient was drooling and leaning to the left and they were concerned about a possible stroke. The patient had chest congestion and was clinching her fists. She is nonverbal at baseline and requires full assistance for all ADLs including eating. The patient was noted to have a CBG of 128 reported by EMS.    PT Comments    Patient is limited by lethargy today. She requires frequent cueing for participation. She requires mod/max assist to transition to seated and requires assist for seated balance today. Patient is returned to supine in bed. Patient will benefit from continued physical therapy in hospital and recommended venue below to increase strength, balance, endurance for safe ADLs and gait.   Follow Up Recommendations  SNF;Supervision for mobility/OOB;Supervision - Intermittent     Equipment Recommendations  None recommended by PT    Recommendations for Other Services       Precautions / Restrictions Precautions Precautions: Fall Restrictions Weight Bearing Restrictions: No    Mobility  Bed Mobility Overal bed mobility: Needs Assistance Bed Mobility: Supine to Sit;Sit to Supine     Supine to sit: Mod assist Sit to supine: Mod assist   General bed mobility comments: increased time, labored movement  Transfers                    Ambulation/Gait                 Stairs             Wheelchair Mobility    Modified Rankin (Stroke Patients Only)       Balance Overall balance assessment: Needs assistance Sitting-balance support: Feet supported;No upper extremity supported Sitting balance-Leahy  Scale: Poor Sitting balance - Comments: seated at EOB                                    Cognition Arousal/Alertness: Lethargic Behavior During Therapy: Flat affect Overall Cognitive Status: History of cognitive impairments - at baseline                                 General Comments: requires constant verbal/tactile cueing to participate      Exercises      General Comments        Pertinent Vitals/Pain Pain Assessment: Faces Faces Pain Scale: No hurt    Home Living                      Prior Function            PT Goals (current goals can now be found in the care plan section) Acute Rehab PT Goals Patient Stated Goal: not stated PT Goal Formulation: With patient Time For Goal Achievement: 01/21/20 Potential to Achieve Goals: Good Progress towards PT goals: Progressing toward goals    Frequency    Min 3X/week      PT Plan Current plan remains appropriate    Co-evaluation              AM-PAC  PT "6 Clicks" Mobility   Outcome Measure  Help needed turning from your back to your side while in a flat bed without using bedrails?: A Lot Help needed moving from lying on your back to sitting on the side of a flat bed without using bedrails?: A Lot Help needed moving to and from a bed to a chair (including a wheelchair)?: A Lot Help needed standing up from a chair using your arms (e.g., wheelchair or bedside chair)?: A Lot Help needed to walk in hospital room?: A Lot Help needed climbing 3-5 steps with a railing? : Total 6 Click Score: 11    End of Session   Activity Tolerance: Patient limited by lethargy Patient left: with call bell/phone within reach;in bed;with bed alarm set Nurse Communication: Mobility status PT Visit Diagnosis: Unsteadiness on feet (R26.81);Other abnormalities of gait and mobility (R26.89);Muscle weakness (generalized) (M62.81)     Time: 2831-5176 PT Time Calculation (min) (ACUTE ONLY): 11  min  Charges:  $Therapeutic Activity: 8-22 mins                     10:42 AM, 01/08/20 Mearl Latin PT, DPT Physical Therapist at [facility]

## 2020-01-09 DIAGNOSIS — R0902 Hypoxemia: Secondary | ICD-10-CM | POA: Diagnosis not present

## 2020-01-09 DIAGNOSIS — I10 Essential (primary) hypertension: Secondary | ICD-10-CM | POA: Diagnosis not present

## 2020-01-09 DIAGNOSIS — E860 Dehydration: Secondary | ICD-10-CM | POA: Diagnosis not present

## 2020-01-09 DIAGNOSIS — R6521 Severe sepsis with septic shock: Secondary | ICD-10-CM | POA: Diagnosis not present

## 2020-01-09 DIAGNOSIS — F329 Major depressive disorder, single episode, unspecified: Secondary | ICD-10-CM | POA: Diagnosis not present

## 2020-01-09 DIAGNOSIS — Z8673 Personal history of transient ischemic attack (TIA), and cerebral infarction without residual deficits: Secondary | ICD-10-CM | POA: Diagnosis not present

## 2020-01-09 DIAGNOSIS — J9601 Acute respiratory failure with hypoxia: Secondary | ICD-10-CM | POA: Diagnosis not present

## 2020-01-09 DIAGNOSIS — A419 Sepsis, unspecified organism: Secondary | ICD-10-CM | POA: Diagnosis not present

## 2020-01-09 DIAGNOSIS — R4020 Unspecified coma: Secondary | ICD-10-CM | POA: Diagnosis not present

## 2020-01-09 DIAGNOSIS — J9600 Acute respiratory failure, unspecified whether with hypoxia or hypercapnia: Secondary | ICD-10-CM | POA: Diagnosis not present

## 2020-01-09 DIAGNOSIS — E785 Hyperlipidemia, unspecified: Secondary | ICD-10-CM | POA: Diagnosis present

## 2020-01-09 DIAGNOSIS — Z515 Encounter for palliative care: Secondary | ICD-10-CM | POA: Diagnosis not present

## 2020-01-09 DIAGNOSIS — R464 Slowness and poor responsiveness: Secondary | ICD-10-CM | POA: Diagnosis not present

## 2020-01-09 DIAGNOSIS — J449 Chronic obstructive pulmonary disease, unspecified: Secondary | ICD-10-CM | POA: Diagnosis present

## 2020-01-09 DIAGNOSIS — R4182 Altered mental status, unspecified: Secondary | ICD-10-CM | POA: Diagnosis not present

## 2020-01-09 DIAGNOSIS — R404 Transient alteration of awareness: Secondary | ICD-10-CM | POA: Diagnosis not present

## 2020-01-09 DIAGNOSIS — Z741 Need for assistance with personal care: Secondary | ICD-10-CM | POA: Diagnosis not present

## 2020-01-09 DIAGNOSIS — I959 Hypotension, unspecified: Secondary | ICD-10-CM | POA: Diagnosis not present

## 2020-01-09 DIAGNOSIS — R570 Cardiogenic shock: Secondary | ICD-10-CM | POA: Diagnosis not present

## 2020-01-09 DIAGNOSIS — R6889 Other general symptoms and signs: Secondary | ICD-10-CM | POA: Diagnosis not present

## 2020-01-09 DIAGNOSIS — Z7401 Bed confinement status: Secondary | ICD-10-CM | POA: Diagnosis not present

## 2020-01-09 DIAGNOSIS — Z20828 Contact with and (suspected) exposure to other viral communicable diseases: Secondary | ICD-10-CM | POA: Diagnosis not present

## 2020-01-09 DIAGNOSIS — R5381 Other malaise: Secondary | ICD-10-CM | POA: Diagnosis not present

## 2020-01-09 DIAGNOSIS — Z20822 Contact with and (suspected) exposure to covid-19: Secondary | ICD-10-CM | POA: Diagnosis not present

## 2020-01-09 DIAGNOSIS — L89612 Pressure ulcer of right heel, stage 2: Secondary | ICD-10-CM | POA: Diagnosis present

## 2020-01-09 DIAGNOSIS — F411 Generalized anxiety disorder: Secondary | ICD-10-CM | POA: Diagnosis present

## 2020-01-09 DIAGNOSIS — F0390 Unspecified dementia without behavioral disturbance: Secondary | ICD-10-CM | POA: Diagnosis not present

## 2020-01-09 DIAGNOSIS — U071 COVID-19: Secondary | ICD-10-CM | POA: Diagnosis not present

## 2020-01-09 DIAGNOSIS — L89626 Pressure-induced deep tissue damage of left heel: Secondary | ICD-10-CM | POA: Diagnosis not present

## 2020-01-09 DIAGNOSIS — L89622 Pressure ulcer of left heel, stage 2: Secondary | ICD-10-CM | POA: Diagnosis present

## 2020-01-09 DIAGNOSIS — N390 Urinary tract infection, site not specified: Secondary | ICD-10-CM | POA: Diagnosis not present

## 2020-01-09 DIAGNOSIS — J189 Pneumonia, unspecified organism: Secondary | ICD-10-CM | POA: Diagnosis not present

## 2020-01-09 DIAGNOSIS — G9340 Encephalopathy, unspecified: Secondary | ICD-10-CM | POA: Diagnosis not present

## 2020-01-09 DIAGNOSIS — I2510 Atherosclerotic heart disease of native coronary artery without angina pectoris: Secondary | ICD-10-CM | POA: Diagnosis present

## 2020-01-09 DIAGNOSIS — R1312 Dysphagia, oropharyngeal phase: Secondary | ICD-10-CM | POA: Diagnosis not present

## 2020-01-09 DIAGNOSIS — D539 Nutritional anemia, unspecified: Secondary | ICD-10-CM | POA: Diagnosis present

## 2020-01-09 DIAGNOSIS — K279 Peptic ulcer, site unspecified, unspecified as acute or chronic, without hemorrhage or perforation: Secondary | ICD-10-CM | POA: Diagnosis present

## 2020-01-09 DIAGNOSIS — E876 Hypokalemia: Secondary | ICD-10-CM | POA: Diagnosis not present

## 2020-01-09 DIAGNOSIS — R0989 Other specified symptoms and signs involving the circulatory and respiratory systems: Secondary | ICD-10-CM | POA: Diagnosis not present

## 2020-01-09 DIAGNOSIS — R0689 Other abnormalities of breathing: Secondary | ICD-10-CM | POA: Diagnosis not present

## 2020-01-09 DIAGNOSIS — E782 Mixed hyperlipidemia: Secondary | ICD-10-CM | POA: Diagnosis not present

## 2020-01-09 DIAGNOSIS — R0602 Shortness of breath: Secondary | ICD-10-CM | POA: Diagnosis not present

## 2020-01-09 DIAGNOSIS — I82432 Acute embolism and thrombosis of left popliteal vein: Secondary | ICD-10-CM | POA: Diagnosis present

## 2020-01-09 DIAGNOSIS — G9341 Metabolic encephalopathy: Secondary | ICD-10-CM | POA: Diagnosis present

## 2020-01-09 DIAGNOSIS — I82412 Acute embolism and thrombosis of left femoral vein: Secondary | ICD-10-CM | POA: Diagnosis present

## 2020-01-09 DIAGNOSIS — R509 Fever, unspecified: Secondary | ICD-10-CM | POA: Diagnosis not present

## 2020-01-09 DIAGNOSIS — J690 Pneumonitis due to inhalation of food and vomit: Secondary | ICD-10-CM | POA: Diagnosis not present

## 2020-01-09 DIAGNOSIS — N059 Unspecified nephritic syndrome with unspecified morphologic changes: Secondary | ICD-10-CM | POA: Diagnosis not present

## 2020-01-09 DIAGNOSIS — F339 Major depressive disorder, recurrent, unspecified: Secondary | ICD-10-CM | POA: Diagnosis not present

## 2020-01-09 DIAGNOSIS — L89616 Pressure-induced deep tissue damage of right heel: Secondary | ICD-10-CM | POA: Diagnosis not present

## 2020-01-09 DIAGNOSIS — M6281 Muscle weakness (generalized): Secondary | ICD-10-CM | POA: Diagnosis not present

## 2020-01-09 DIAGNOSIS — E870 Hyperosmolality and hypernatremia: Secondary | ICD-10-CM | POA: Diagnosis present

## 2020-01-09 DIAGNOSIS — I2699 Other pulmonary embolism without acute cor pulmonale: Secondary | ICD-10-CM | POA: Diagnosis present

## 2020-01-09 LAB — CBC WITH DIFFERENTIAL/PLATELET
Abs Immature Granulocytes: 0.02 10*3/uL (ref 0.00–0.07)
Basophils Absolute: 0.1 10*3/uL (ref 0.0–0.1)
Basophils Relative: 1 %
Eosinophils Absolute: 0 10*3/uL (ref 0.0–0.5)
Eosinophils Relative: 0 %
HCT: 36.2 % (ref 36.0–46.0)
Hemoglobin: 12 g/dL (ref 12.0–15.0)
Immature Granulocytes: 0 %
Lymphocytes Relative: 22 %
Lymphs Abs: 1.6 10*3/uL (ref 0.7–4.0)
MCH: 34.7 pg — ABNORMAL HIGH (ref 26.0–34.0)
MCHC: 33.1 g/dL (ref 30.0–36.0)
MCV: 104.6 fL — ABNORMAL HIGH (ref 80.0–100.0)
Monocytes Absolute: 0.7 10*3/uL (ref 0.1–1.0)
Monocytes Relative: 9 %
Neutro Abs: 5.1 10*3/uL (ref 1.7–7.7)
Neutrophils Relative %: 68 %
Platelets: 174 10*3/uL (ref 150–400)
RBC: 3.46 MIL/uL — ABNORMAL LOW (ref 3.87–5.11)
RDW: 12.2 % (ref 11.5–15.5)
WBC: 7.4 10*3/uL (ref 4.0–10.5)
nRBC: 0 % (ref 0.0–0.2)

## 2020-01-09 LAB — RESPIRATORY PANEL BY RT PCR (FLU A&B, COVID)
Influenza A by PCR: NEGATIVE
Influenza B by PCR: NEGATIVE
SARS Coronavirus 2 by RT PCR: NEGATIVE

## 2020-01-09 LAB — COMPREHENSIVE METABOLIC PANEL
ALT: 16 U/L (ref 0–44)
AST: 26 U/L (ref 15–41)
Albumin: 2.9 g/dL — ABNORMAL LOW (ref 3.5–5.0)
Alkaline Phosphatase: 62 U/L (ref 38–126)
Anion gap: 8 (ref 5–15)
BUN: 16 mg/dL (ref 8–23)
CO2: 25 mmol/L (ref 22–32)
Calcium: 9.1 mg/dL (ref 8.9–10.3)
Chloride: 109 mmol/L (ref 98–111)
Creatinine, Ser: 0.65 mg/dL (ref 0.44–1.00)
GFR calc Af Amer: >60 mL/min (ref >60)
GFR calc non Af Amer: >60 mL/min (ref >60)
Glucose, Bld: 104 mg/dL — ABNORMAL HIGH (ref 70–99)
Potassium: 3.6 mmol/L (ref 3.5–5.1)
Sodium: 142 mmol/L (ref 135–145)
Total Bilirubin: 0.7 mg/dL (ref 0.3–1.2)
Total Protein: 5.4 g/dL — ABNORMAL LOW (ref 6.5–8.1)

## 2020-01-09 LAB — MAGNESIUM: Magnesium: 1.6 mg/dL — ABNORMAL LOW (ref 1.7–2.4)

## 2020-01-09 MED ORDER — MAGNESIUM SULFATE 2 GM/50ML IV SOLN
2.0000 g | Freq: Once | INTRAVENOUS | Status: AC
  Administered 2020-01-09: 2 g via INTRAVENOUS
  Filled 2020-01-09: qty 50

## 2020-01-09 MED ORDER — ACETAMINOPHEN 325 MG PO TABS: 650.0000 mg | ORAL_TABLET | Freq: Three times a day (TID) | ORAL | Status: AC

## 2020-01-09 NOTE — TOC Transition Note (Signed)
Transition of Care [facility]) - CM/SW Discharge Note   Patient Details  Name: SORAH FALKENSTEIN MRN: [id number] Date of Birth: July 27, 1951  Transition of Care [facility]) CM/SW Contact:  Malcolm Metro, RN Phone Number: 01/09/2020, 10:45 AM   Clinical Narrative:   DC to SNF today. Christell Constant, aware of DC. Facility has all required documentation and is ready to accept. RN/unit Diplomatic Services operational officer to call for EMS transport once pt ready.     Final next level of care: Skilled Nursing Facility Barriers to Discharge: No Barriers Identified   Patient Goals and CMS Choice Patient states their goals for this hospitalization and ongoing recovery are:: get stronger CMS Medicare.gov Compare Post Acute Care list provided to:: Patient Represenative (must comment)(grandson) Choice offered to / list presented to : Adult Children  Discharge Placement              Patient chooses bed at: [facility] Patient to be transferred to facility by: [facility] Name of family member notified: Feliz Beam Patient and family notified of of transfer: 01/09/20  Discharge Plan and Services                                     Social Determinants of Health (SDOH) Interventions     Readmission Risk Interventions Readmission Risk Prevention Plan 01/09/2020  Transportation Screening Complete  PCP or Specialist Appt within 5-7 Days Complete  Home Care Screening Complete  Medication Review (RN CM) Complete  Some recent data might be hidden

## 2020-01-09 NOTE — Progress Notes (Signed)
Report called to [place] at [facility]

## 2020-01-09 NOTE — Progress Notes (Signed)
Discharged from the unit accompanied by EMS

## 2020-01-11 LAB — CULTURE, BLOOD (ROUTINE X 2)
Culture: NO GROWTH
Culture: NO GROWTH
Special Requests: ADEQUATE
Special Requests: ADEQUATE

## 2020-01-15 DIAGNOSIS — N390 Urinary tract infection, site not specified: Secondary | ICD-10-CM | POA: Diagnosis not present

## 2020-01-15 DIAGNOSIS — F0390 Unspecified dementia without behavioral disturbance: Secondary | ICD-10-CM | POA: Diagnosis not present

## 2020-01-15 DIAGNOSIS — G9341 Metabolic encephalopathy: Secondary | ICD-10-CM | POA: Diagnosis not present

## 2020-01-15 DIAGNOSIS — I10 Essential (primary) hypertension: Secondary | ICD-10-CM | POA: Diagnosis not present

## 2020-01-16 DIAGNOSIS — I10 Essential (primary) hypertension: Secondary | ICD-10-CM | POA: Diagnosis not present

## 2020-01-16 DIAGNOSIS — J449 Chronic obstructive pulmonary disease, unspecified: Secondary | ICD-10-CM | POA: Diagnosis not present

## 2020-01-16 DIAGNOSIS — E785 Hyperlipidemia, unspecified: Secondary | ICD-10-CM | POA: Diagnosis not present

## 2020-01-16 DIAGNOSIS — G9341 Metabolic encephalopathy: Secondary | ICD-10-CM | POA: Diagnosis not present

## 2020-01-19 DIAGNOSIS — J449 Chronic obstructive pulmonary disease, unspecified: Secondary | ICD-10-CM | POA: Diagnosis not present

## 2020-01-19 DIAGNOSIS — E785 Hyperlipidemia, unspecified: Secondary | ICD-10-CM | POA: Diagnosis not present

## 2020-01-19 DIAGNOSIS — I10 Essential (primary) hypertension: Secondary | ICD-10-CM | POA: Diagnosis not present

## 2020-01-19 DIAGNOSIS — R5381 Other malaise: Secondary | ICD-10-CM | POA: Diagnosis not present

## 2020-01-25 DIAGNOSIS — U071 COVID-19: Secondary | ICD-10-CM | POA: Diagnosis not present

## 2020-01-25 DIAGNOSIS — Z20828 Contact with and (suspected) exposure to other viral communicable diseases: Secondary | ICD-10-CM | POA: Diagnosis not present

## 2020-01-29 DIAGNOSIS — N390 Urinary tract infection, site not specified: Secondary | ICD-10-CM | POA: Diagnosis not present

## 2020-01-29 DIAGNOSIS — G9341 Metabolic encephalopathy: Secondary | ICD-10-CM | POA: Diagnosis not present

## 2020-01-29 DIAGNOSIS — F0390 Unspecified dementia without behavioral disturbance: Secondary | ICD-10-CM | POA: Diagnosis not present

## 2020-01-29 DIAGNOSIS — J449 Chronic obstructive pulmonary disease, unspecified: Secondary | ICD-10-CM | POA: Diagnosis not present

## 2020-02-01 DIAGNOSIS — U071 COVID-19: Secondary | ICD-10-CM | POA: Diagnosis not present

## 2020-02-01 DIAGNOSIS — Z20828 Contact with and (suspected) exposure to other viral communicable diseases: Secondary | ICD-10-CM | POA: Diagnosis not present

## 2020-02-02 DIAGNOSIS — U071 COVID-19: Secondary | ICD-10-CM | POA: Diagnosis not present

## 2020-02-02 DIAGNOSIS — I2510 Atherosclerotic heart disease of native coronary artery without angina pectoris: Secondary | ICD-10-CM | POA: Diagnosis present

## 2020-02-02 DIAGNOSIS — F0390 Unspecified dementia without behavioral disturbance: Secondary | ICD-10-CM | POA: Diagnosis present

## 2020-02-02 DIAGNOSIS — K279 Peptic ulcer, site unspecified, unspecified as acute or chronic, without hemorrhage or perforation: Secondary | ICD-10-CM | POA: Diagnosis present

## 2020-02-02 DIAGNOSIS — E876 Hypokalemia: Secondary | ICD-10-CM | POA: Diagnosis not present

## 2020-02-02 DIAGNOSIS — I2699 Other pulmonary embolism without acute cor pulmonale: Secondary | ICD-10-CM | POA: Diagnosis not present

## 2020-02-02 DIAGNOSIS — I82412 Acute embolism and thrombosis of left femoral vein: Secondary | ICD-10-CM | POA: Diagnosis present

## 2020-02-02 DIAGNOSIS — A419 Sepsis, unspecified organism: Secondary | ICD-10-CM | POA: Diagnosis not present

## 2020-02-02 DIAGNOSIS — E785 Hyperlipidemia, unspecified: Secondary | ICD-10-CM | POA: Diagnosis present

## 2020-02-02 DIAGNOSIS — R55 Syncope and collapse: Secondary | ICD-10-CM | POA: Diagnosis not present

## 2020-02-02 DIAGNOSIS — J690 Pneumonitis due to inhalation of food and vomit: Secondary | ICD-10-CM | POA: Diagnosis not present

## 2020-02-02 DIAGNOSIS — N390 Urinary tract infection, site not specified: Secondary | ICD-10-CM | POA: Diagnosis not present

## 2020-02-02 DIAGNOSIS — R570 Cardiogenic shock: Secondary | ICD-10-CM | POA: Diagnosis not present

## 2020-02-02 DIAGNOSIS — G9341 Metabolic encephalopathy: Secondary | ICD-10-CM | POA: Diagnosis not present

## 2020-02-02 DIAGNOSIS — F411 Generalized anxiety disorder: Secondary | ICD-10-CM | POA: Diagnosis present

## 2020-02-02 DIAGNOSIS — R4020 Unspecified coma: Secondary | ICD-10-CM | POA: Diagnosis not present

## 2020-02-02 DIAGNOSIS — R404 Transient alteration of awareness: Secondary | ICD-10-CM | POA: Diagnosis not present

## 2020-02-02 DIAGNOSIS — Z20828 Contact with and (suspected) exposure to other viral communicable diseases: Secondary | ICD-10-CM | POA: Diagnosis not present

## 2020-02-02 DIAGNOSIS — R0902 Hypoxemia: Secondary | ICD-10-CM | POA: Diagnosis not present

## 2020-02-02 DIAGNOSIS — J9601 Acute respiratory failure with hypoxia: Secondary | ICD-10-CM | POA: Diagnosis not present

## 2020-02-02 DIAGNOSIS — J9600 Acute respiratory failure, unspecified whether with hypoxia or hypercapnia: Secondary | ICD-10-CM | POA: Diagnosis not present

## 2020-02-02 DIAGNOSIS — L89612 Pressure ulcer of right heel, stage 2: Secondary | ICD-10-CM | POA: Diagnosis present

## 2020-02-02 DIAGNOSIS — R509 Fever, unspecified: Secondary | ICD-10-CM | POA: Diagnosis not present

## 2020-02-02 DIAGNOSIS — Z515 Encounter for palliative care: Secondary | ICD-10-CM | POA: Diagnosis not present

## 2020-02-02 DIAGNOSIS — R6521 Severe sepsis with septic shock: Secondary | ICD-10-CM | POA: Diagnosis not present

## 2020-02-02 DIAGNOSIS — R464 Slowness and poor responsiveness: Secondary | ICD-10-CM | POA: Diagnosis not present

## 2020-02-02 DIAGNOSIS — L89622 Pressure ulcer of left heel, stage 2: Secondary | ICD-10-CM | POA: Diagnosis present

## 2020-02-02 DIAGNOSIS — R0989 Other specified symptoms and signs involving the circulatory and respiratory systems: Secondary | ICD-10-CM | POA: Diagnosis not present

## 2020-02-02 DIAGNOSIS — E870 Hyperosmolality and hypernatremia: Secondary | ICD-10-CM | POA: Diagnosis present

## 2020-02-02 DIAGNOSIS — R0602 Shortness of breath: Secondary | ICD-10-CM | POA: Diagnosis not present

## 2020-02-02 DIAGNOSIS — I82432 Acute embolism and thrombosis of left popliteal vein: Secondary | ICD-10-CM | POA: Diagnosis present

## 2020-02-02 DIAGNOSIS — J449 Chronic obstructive pulmonary disease, unspecified: Secondary | ICD-10-CM | POA: Diagnosis present

## 2020-02-02 DIAGNOSIS — R0689 Other abnormalities of breathing: Secondary | ICD-10-CM | POA: Diagnosis not present

## 2020-02-02 DIAGNOSIS — D539 Nutritional anemia, unspecified: Secondary | ICD-10-CM | POA: Diagnosis present

## 2020-02-02 DIAGNOSIS — F329 Major depressive disorder, single episode, unspecified: Secondary | ICD-10-CM | POA: Diagnosis not present

## 2020-02-02 DIAGNOSIS — I10 Essential (primary) hypertension: Secondary | ICD-10-CM | POA: Diagnosis present

## 2020-02-02 DIAGNOSIS — Z20822 Contact with and (suspected) exposure to covid-19: Secondary | ICD-10-CM | POA: Diagnosis not present

## 2020-02-02 DIAGNOSIS — J189 Pneumonia, unspecified organism: Secondary | ICD-10-CM | POA: Diagnosis not present

## 2020-02-16 DIAGNOSIS — J690 Pneumonitis due to inhalation of food and vomit: Secondary | ICD-10-CM | POA: Diagnosis not present

## 2020-02-16 DIAGNOSIS — I2699 Other pulmonary embolism without acute cor pulmonale: Secondary | ICD-10-CM | POA: Diagnosis not present

## 2020-02-16 DIAGNOSIS — J9600 Acute respiratory failure, unspecified whether with hypoxia or hypercapnia: Secondary | ICD-10-CM | POA: Diagnosis not present

## 2020-02-17 DIAGNOSIS — J690 Pneumonitis due to inhalation of food and vomit: Secondary | ICD-10-CM | POA: Diagnosis not present

## 2020-02-17 DIAGNOSIS — I2699 Other pulmonary embolism without acute cor pulmonale: Secondary | ICD-10-CM | POA: Diagnosis not present

## 2020-02-17 DIAGNOSIS — J9600 Acute respiratory failure, unspecified whether with hypoxia or hypercapnia: Secondary | ICD-10-CM | POA: Diagnosis not present

## 2020-02-18 DIAGNOSIS — J690 Pneumonitis due to inhalation of food and vomit: Secondary | ICD-10-CM | POA: Diagnosis not present

## 2020-02-18 DIAGNOSIS — J9600 Acute respiratory failure, unspecified whether with hypoxia or hypercapnia: Secondary | ICD-10-CM | POA: Diagnosis not present

## 2020-02-18 DIAGNOSIS — I2699 Other pulmonary embolism without acute cor pulmonale: Secondary | ICD-10-CM | POA: Diagnosis not present

## 2020-02-19 DIAGNOSIS — J9600 Acute respiratory failure, unspecified whether with hypoxia or hypercapnia: Secondary | ICD-10-CM | POA: Diagnosis not present

## 2020-02-19 DIAGNOSIS — J690 Pneumonitis due to inhalation of food and vomit: Secondary | ICD-10-CM | POA: Diagnosis not present

## 2020-02-19 DIAGNOSIS — I2699 Other pulmonary embolism without acute cor pulmonale: Secondary | ICD-10-CM | POA: Diagnosis not present

## 2020-02-20 DIAGNOSIS — I2699 Other pulmonary embolism without acute cor pulmonale: Secondary | ICD-10-CM | POA: Diagnosis not present

## 2020-02-20 DIAGNOSIS — J9600 Acute respiratory failure, unspecified whether with hypoxia or hypercapnia: Secondary | ICD-10-CM | POA: Diagnosis not present

## 2020-02-20 DIAGNOSIS — J690 Pneumonitis due to inhalation of food and vomit: Secondary | ICD-10-CM | POA: Diagnosis not present

## 2020-03-08 DEATH — deceased

## 2020-11-12 IMAGING — DX DG CHEST 1V PORT
2 series · 2 of 2 positions shown · non-contrast
Comparison: 09/30/2017.

CLINICAL DATA: Altered mental status.

EXAM:
PORTABLE CHEST 1 VIEW

[chest ap (1 of 2)]
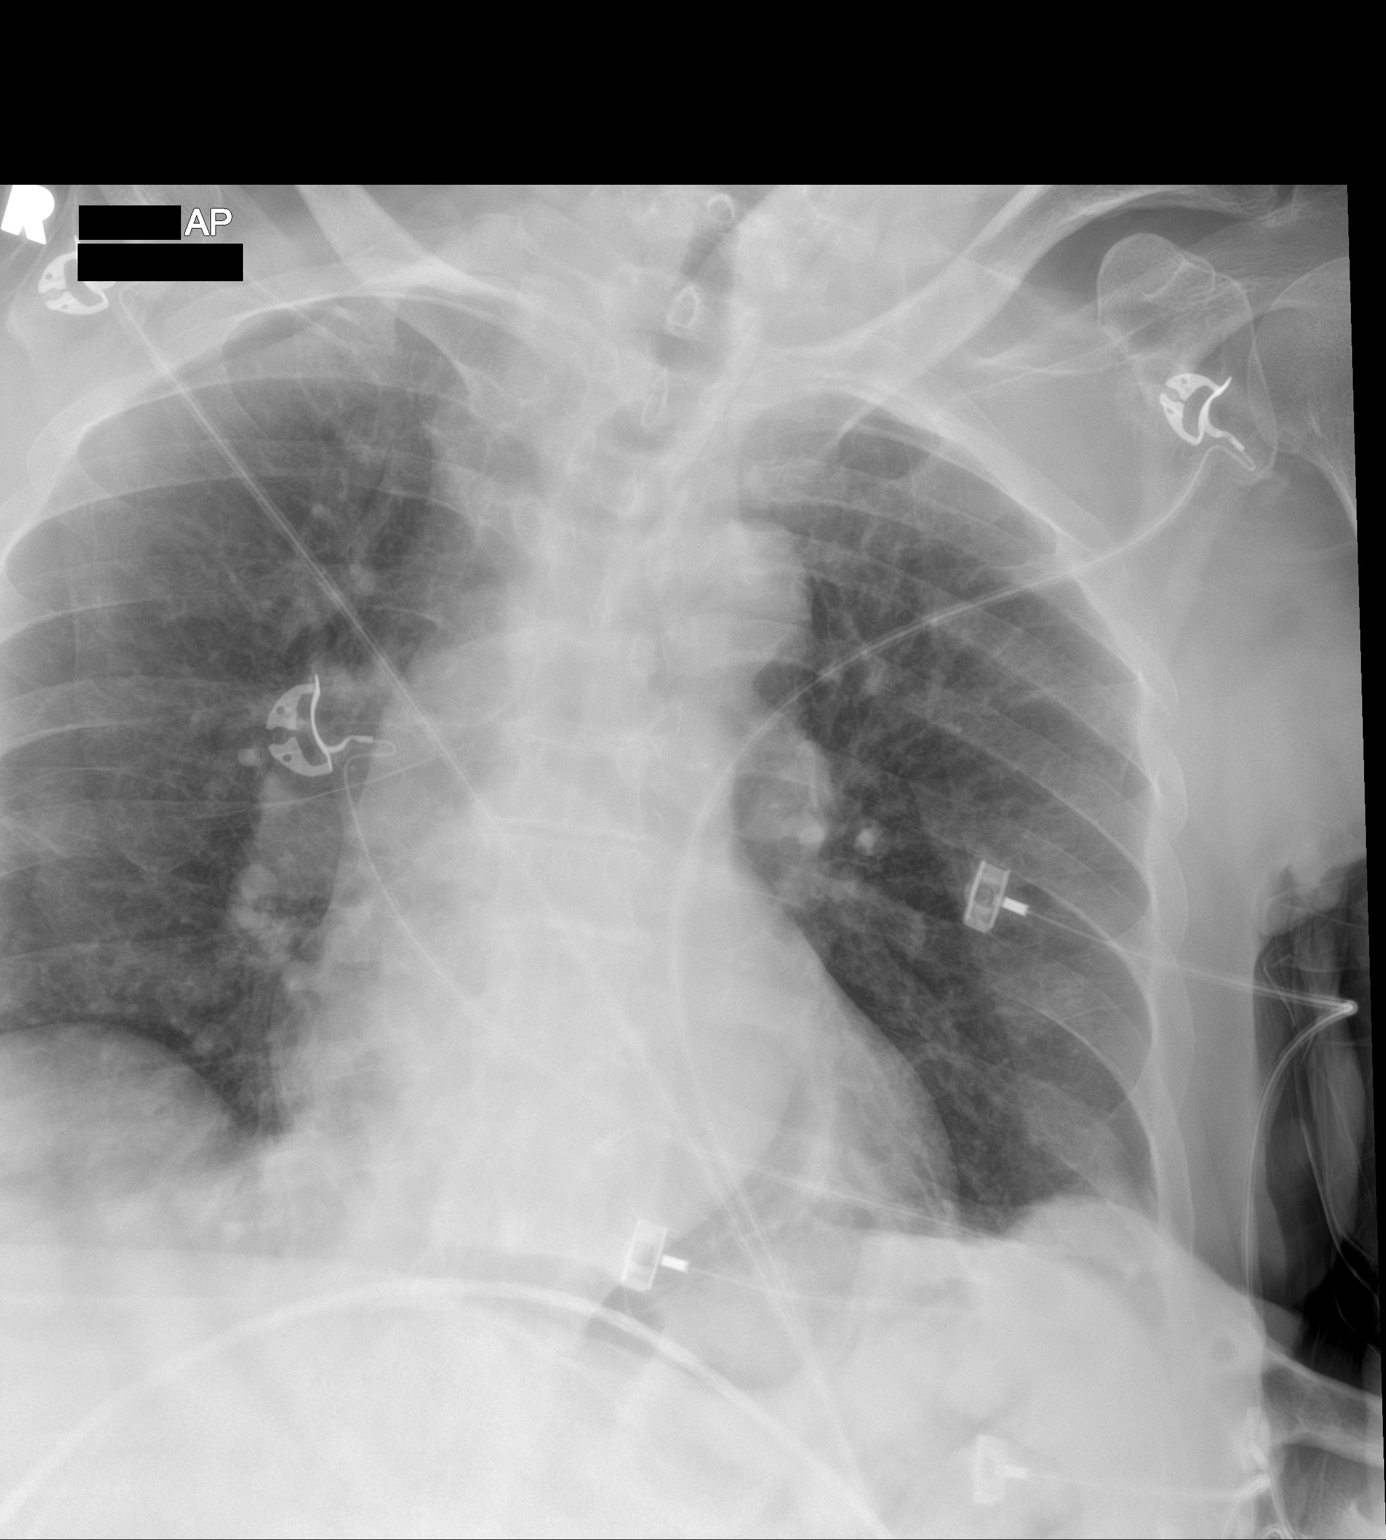

[chest ap (2 of 2)]
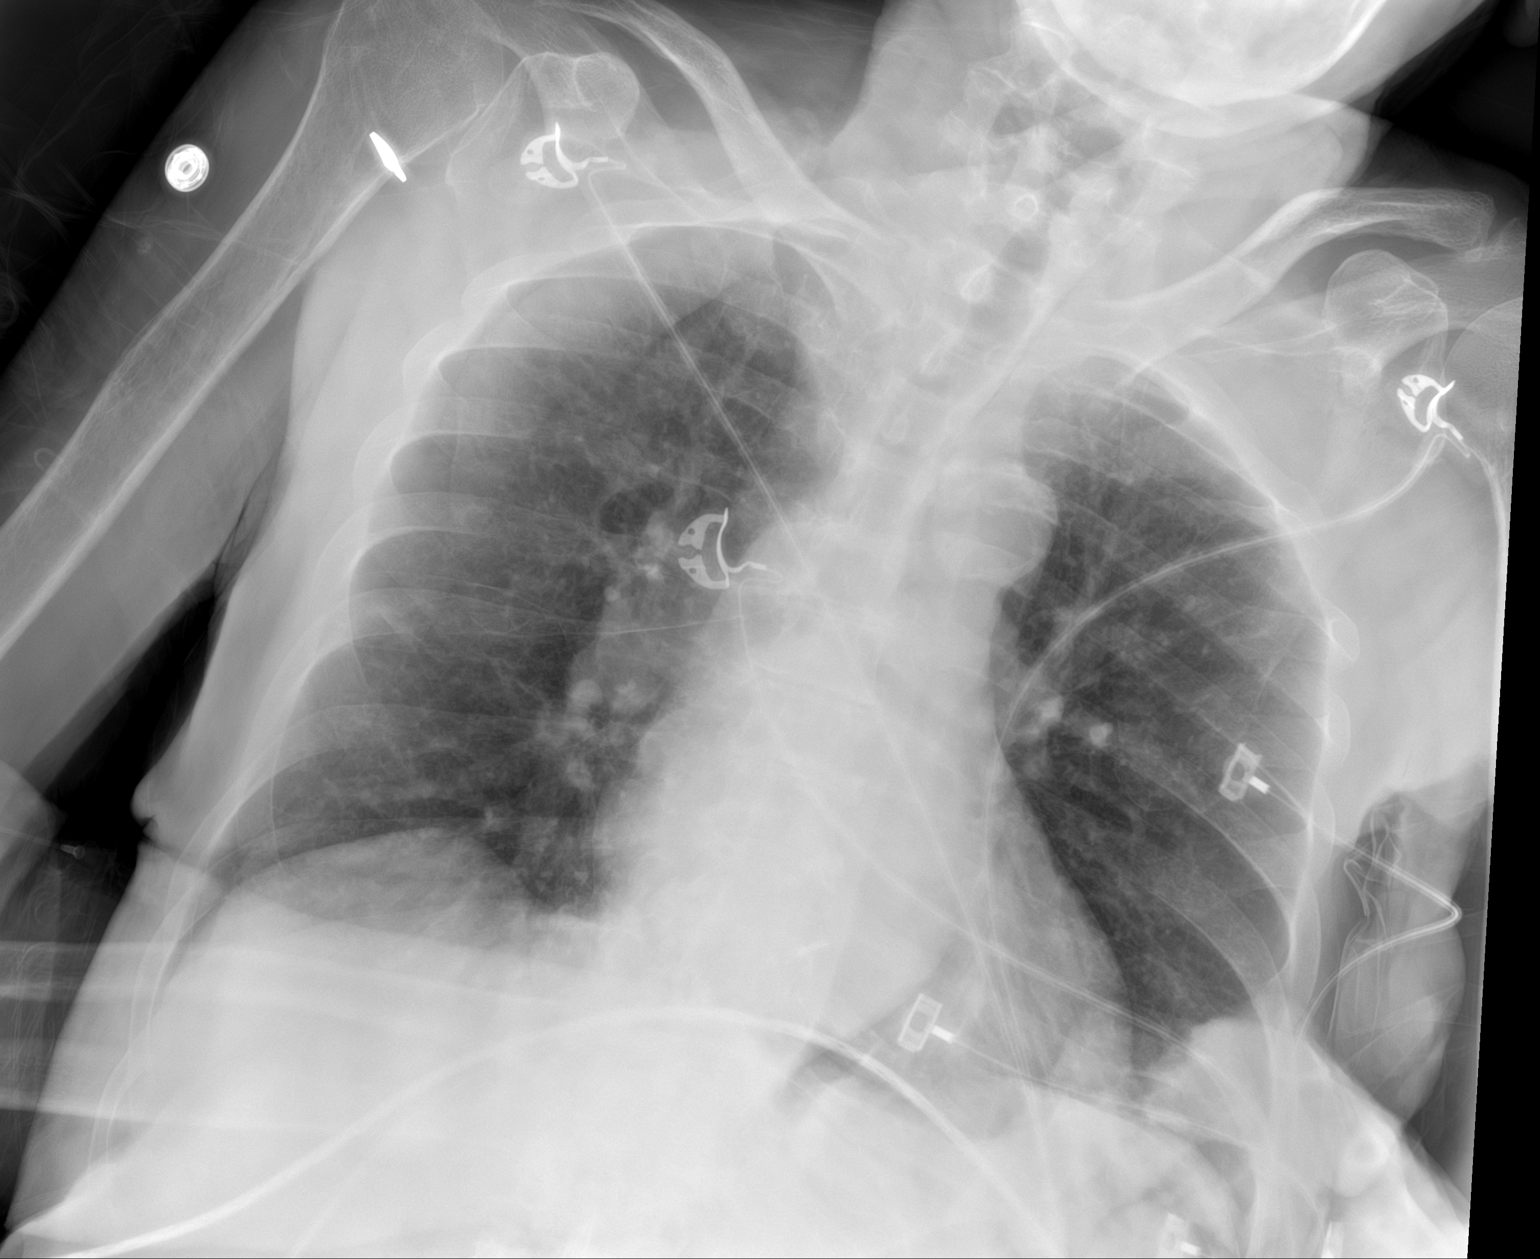

[2 of 2 positions shown; findings below may reference images not displayed]

FINDINGS: Normal sized heart. Tortuous and calcified thoracic aorta. Clear
lungs. Unremarkable bones.
IMPRESSION: No acute abnormality.

## 2020-12-12 IMAGING — CT CT CERVICAL SPINE W/O CM
2 series · 12 of 27 positions shown, 15 images · non-contrast
Comparison: 09/23/2019

CLINICAL DATA: Unwitnessed fall with forehead injury.

EXAM:
CT HEAD WITHOUT CONTRAST
CT CERVICAL SPINE WITHOUT CONTRAST
TECHNIQUE: Multidetector CT imaging of the head and cervical spine was
performed following the standard protocol without intravenous
contrast. Multiplanar CT image reconstructions of the cervical spine
were also generated.

[Series 4: c spine soft · axial · 0.50mm/px · z∈[-106,+42]mm · 7 of 85 slices shown, 9 images]
[im 7/85  soft-tissue]
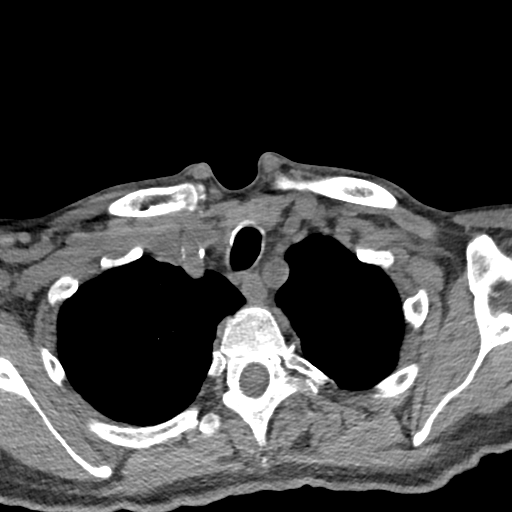
[im 7/85  bone]
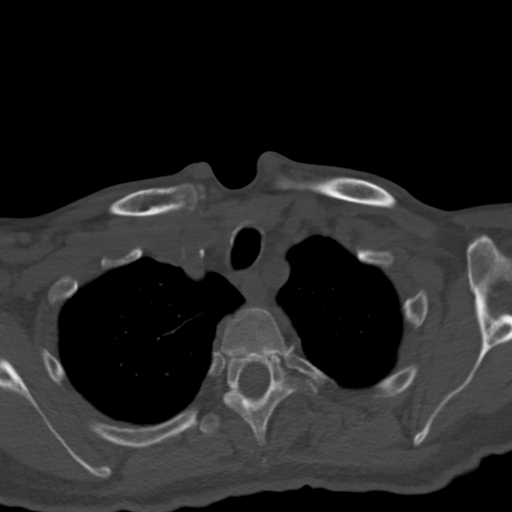
[im 20/85  bone]
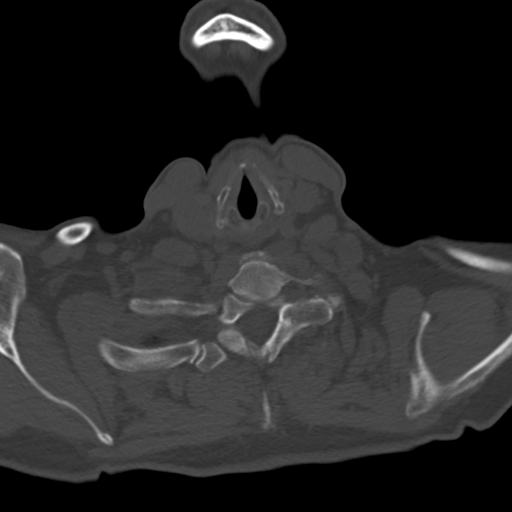
[im 33/85  bone]
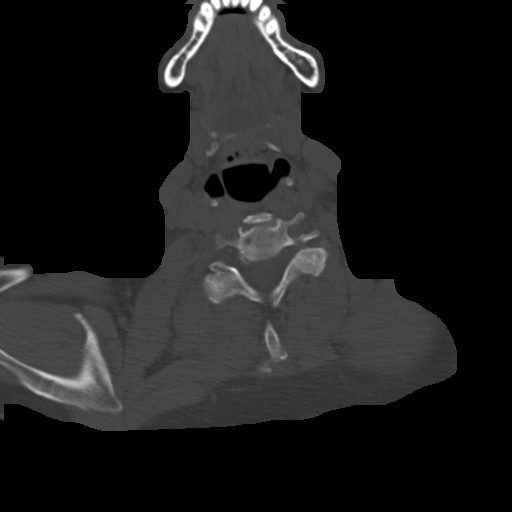
[im 46/85  bone]
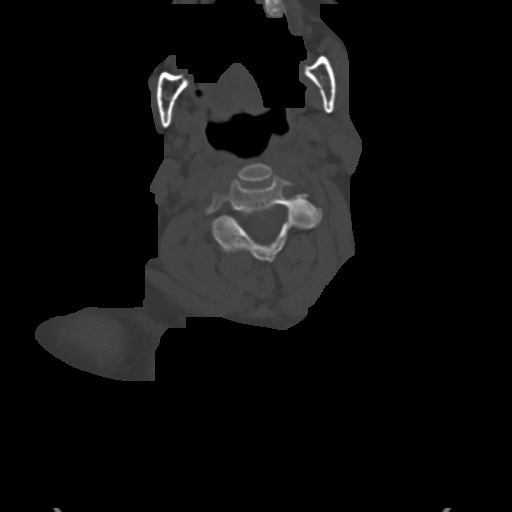
[im 52/85  soft-tissue]
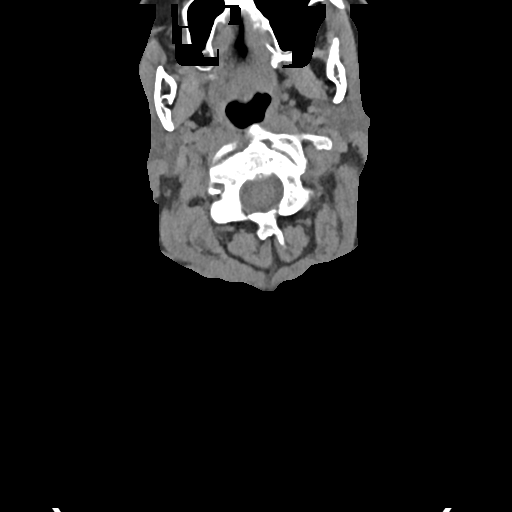
[im 52/85  bone]
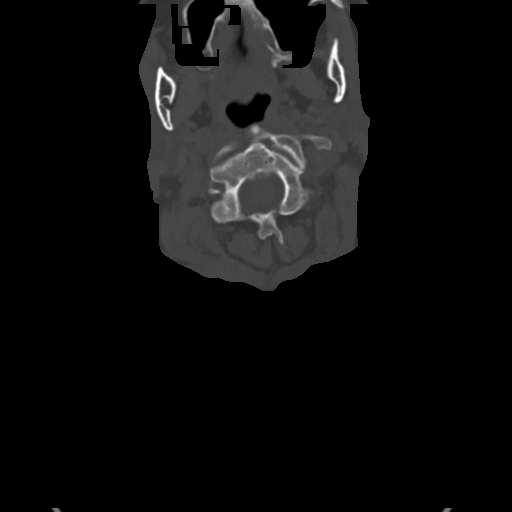
[im 65/85  bone]
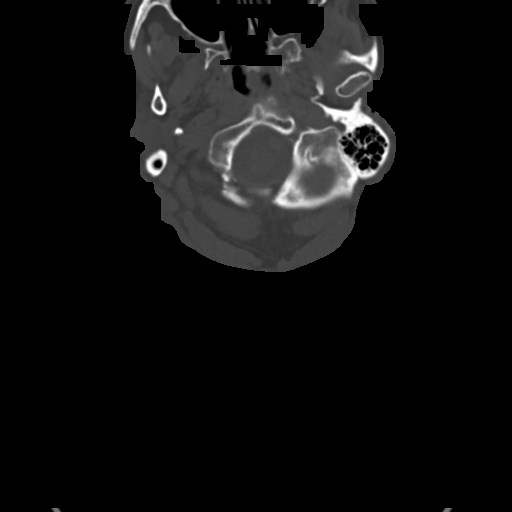
[im 78/85  bone]
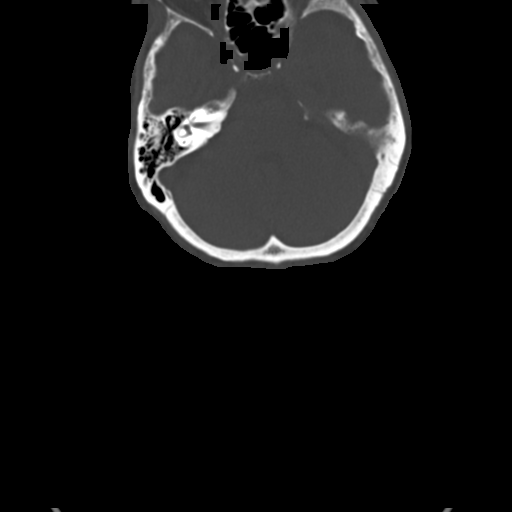

[Series 5: sagittal bone · sagittal · 0.26mm/px · 5 of 61 slices shown, 6 images]
[im 21/61  bone]
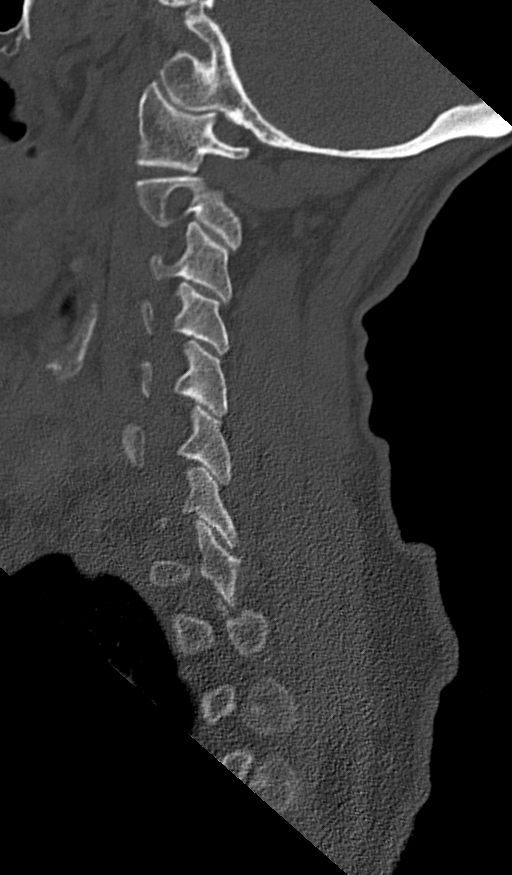
[im 26/61  bone]
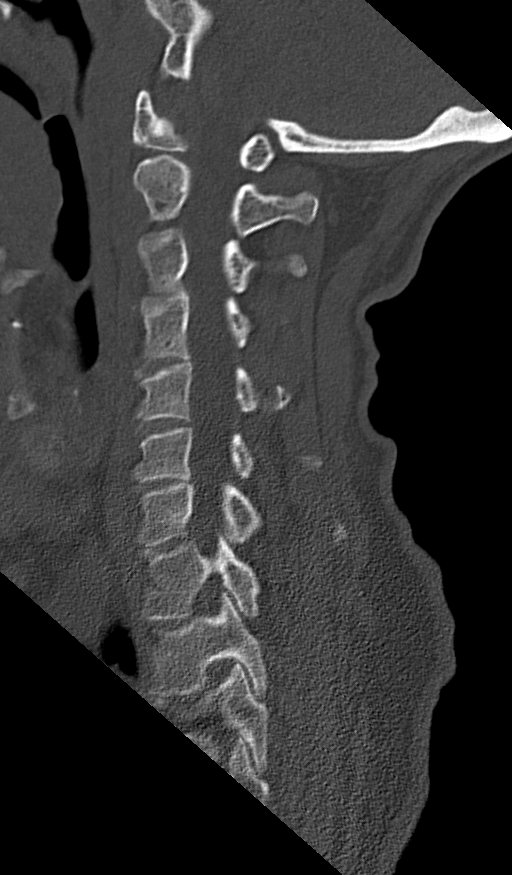
[im 31/61  soft-tissue]
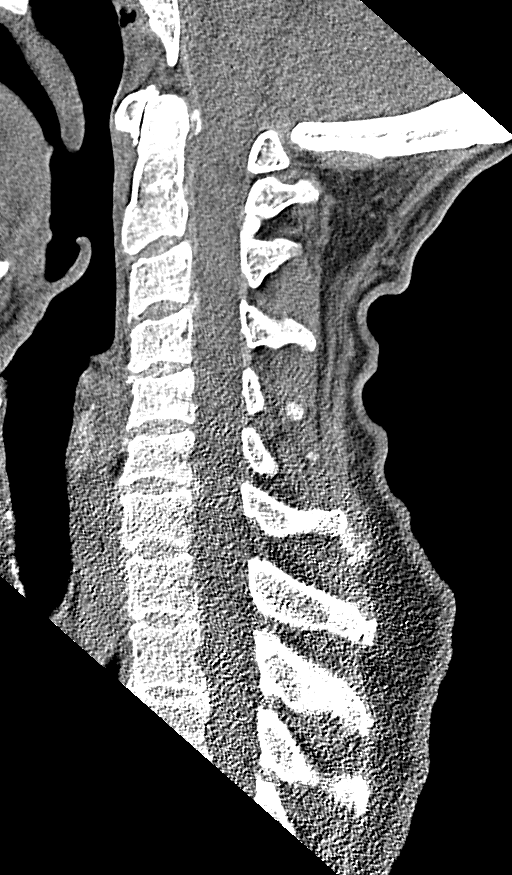
[im 31/61  bone]
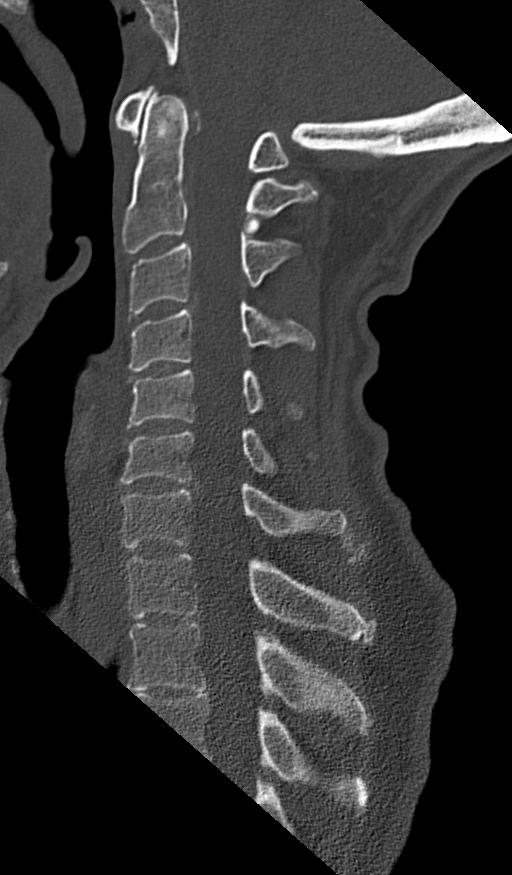
[im 36/61  bone]
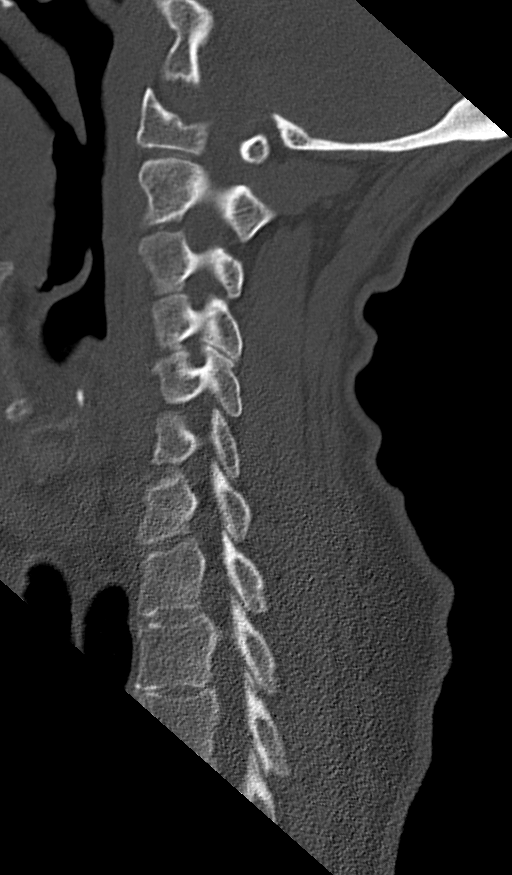
[im 41/61  bone]
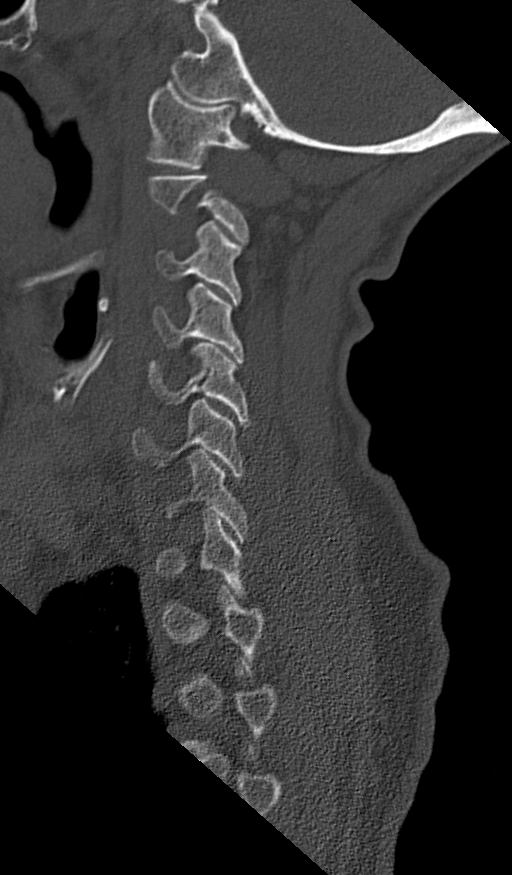

[12 of 27 positions shown; findings below may reference images not displayed]

FINDINGS: CT HEAD FINDINGS

Brain: Small to moderate subarachnoid hemorrhage along the left
sylvian fissure, peripheral location compatible with history of
trauma. No other hemorrhage or brain swelling is noted. Negative for
infarct, hydrocephalus, or masslike finding.

Vascular: No hyperdense vessel or unexpected calcification.

Skull: Negative for fracture.  Left forehead swelling that is mild.

Sinuses/Orbits: Bilateral cataract resection.

CT CERVICAL SPINE FINDINGS

Alignment: Straightening of the cervical spine. No traumatic
malalignment.

Skull base and vertebrae: Negative for fracture or focal lesion.

Soft tissues and spinal canal: No prevertebral fluid or swelling. No
visible canal hematoma.

Disc levels: Ordinary degenerative changes. No visible cord
impingement

Upper chest: Negative
IMPRESSION: 1. History of trauma with subarachnoid hemorrhage along the left
cerebral convexity.
2. Negative for cervical spine fracture.
